# Patient Record
Sex: Female | Born: 1970 | Race: Black or African American | Hispanic: No | Marital: Single | Smoking: Never smoker
Health system: Southern US, Community
[De-identification: ages and names within clinical notes are randomized; demographics above are authoritative.]

## PROBLEM LIST (undated history)

## (undated) DIAGNOSIS — E119 Type 2 diabetes mellitus without complications: Secondary | ICD-10-CM

## (undated) DIAGNOSIS — G43909 Migraine, unspecified, not intractable, without status migrainosus: Secondary | ICD-10-CM

## (undated) DIAGNOSIS — I10 Essential (primary) hypertension: Secondary | ICD-10-CM

## (undated) DIAGNOSIS — E785 Hyperlipidemia, unspecified: Secondary | ICD-10-CM

## (undated) DIAGNOSIS — D219 Benign neoplasm of connective and other soft tissue, unspecified: Secondary | ICD-10-CM

## (undated) HISTORY — DX: Hyperlipidemia, unspecified: E78.5

## (undated) HISTORY — DX: Migraine, unspecified, not intractable, without status migrainosus: G43.909

## (undated) HISTORY — DX: Type 2 diabetes mellitus without complications: E11.9

## (undated) HISTORY — DX: Benign neoplasm of connective and other soft tissue, unspecified: D21.9

## (undated) HISTORY — DX: Essential (primary) hypertension: I10

---

## 2011-01-16 DIAGNOSIS — G43909 Migraine, unspecified, not intractable, without status migrainosus: Secondary | ICD-10-CM | POA: Insufficient documentation

## 2011-01-16 DIAGNOSIS — D649 Anemia, unspecified: Secondary | ICD-10-CM | POA: Insufficient documentation

## 2016-06-24 DIAGNOSIS — I10 Essential (primary) hypertension: Secondary | ICD-10-CM | POA: Insufficient documentation

## 2017-12-25 ENCOUNTER — Ambulatory Visit (INDEPENDENT_AMBULATORY_CARE_PROVIDER_SITE_OTHER): Payer: No Typology Code available for payment source

## 2017-12-25 ENCOUNTER — Telehealth (INDEPENDENT_AMBULATORY_CARE_PROVIDER_SITE_OTHER): Payer: Self-pay | Admitting: Orthopaedic Surgery

## 2017-12-25 ENCOUNTER — Ambulatory Visit (INDEPENDENT_AMBULATORY_CARE_PROVIDER_SITE_OTHER): Payer: No Typology Code available for payment source | Admitting: Orthopaedic Surgery

## 2017-12-25 ENCOUNTER — Encounter (INDEPENDENT_AMBULATORY_CARE_PROVIDER_SITE_OTHER): Payer: Self-pay | Admitting: Orthopaedic Surgery

## 2017-12-25 VITALS — BP 216/133 | HR 96 | Ht 66.0 in | Wt 172.0 lb

## 2017-12-25 DIAGNOSIS — M545 Low back pain, unspecified: Secondary | ICD-10-CM

## 2017-12-25 DIAGNOSIS — I10 Essential (primary) hypertension: Secondary | ICD-10-CM | POA: Diagnosis not present

## 2017-12-25 NOTE — Progress Notes (Signed)
Office Visit Note   Patient: Susan Mathis           Date of Birth: [birth year]           MRN: [id number] Visit Date: 12/25/2017              Requested by: No referring provider defined for this encounter. PCP: Patient, No Pcp Per   Assessment & Plan: Visit Diagnoses:  1. Acute midline low back pain without sciatica   2. Essential hypertension              UNCONTROLLED and discussed with pt she needs urgent treatment.   Plan: We will set patient up for course of physical therapy once is approved by [employer] patient has medication for blood pressure but has not gotten it filled.  Her blood pressure is elevated at 216/132 and she needs to seek medical treatment urgently.  Discussed with her she will not be able to have physical therapy with her blood pressure not controlled.  She likely will need a second agent to get her blood pressure down to normal.  I plan to recheck her in 5 weeks.  We discussed in detail that with uncontrolled elevated blood pressure she is at significant risk for stroke or heart attack.  Follow-Up Instructions: Return in about 5 weeks (around 01/29/2018).   Orders:  Orders Placed This Encounter  Procedures  . XR Lumbar Spine 2-3 Views  . Ambulatory referral to Physical Therapy   No orders of the defined types were placed in this encounter.     Procedures: No procedures performed   Clinical Data: No additional findings.   Subjective: Chief Complaint  Patient presents with  . Lower Back - Pain    HPI 47 year old female new patient visit for on-the-job injury that occurred on 10/13/2017.  She works at [employer] and was trying to get in order out 27 pound boxes about 12 of them and did not notice pain immediately but woke up in excruciating pain the following day mid back and mid lower back.  She was seen in the emergency department in [place] [facility] had x-rays obtained.  She is used Tylenol Goody powder.  She has had a history of some low back pain  intermittently for 4 to 5 years.  Patient states she just moved to the area will be in [place] and is not going back to [facility].  Moderate.  She denies associated bowel or bladder symptoms no fever or chills.  Review of Systems positive for hypertension on HCTZ.  Previous C-section.  History of hypertension, fibroids, and migraines.   Intermittant low back pain intermittent, off and on x4 to 5 years   Objective: Vital Signs: BP (!) 216/133   Pulse 96   Ht 5\' 6"  (1.676 m)   Wt 172 lb (78 kg)   BMI 27.76 kg/m   Physical Exam  Constitutional: She is oriented to person, place, and time. She appears well-developed.  HENT:  Head: Normocephalic.  Right Ear: External ear normal.  Left Ear: External ear normal.  Eyes: Pupils are equal, round, and reactive to light.  Neck: No tracheal deviation present. No thyromegaly present.  Cardiovascular: Normal rate.  Pulmonary/Chest: Effort normal.  Abdominal: Soft.  Neurological: She is alert and oriented to person, place, and time.  Skin: Skin is warm and dry.  Psychiatric: She has a normal mood and affect. Her behavior is normal.    Ortho Exam patient has some tenderness palpation of the  lumbar spine.  Faber test.  Knee and ankle jerk are 2+ and symmetrical.  Anterior tib gastrocsoleus is intact.  She has some discomfort with forward bending and lumbar extension as well as lateral bending.  Quads hip flexors ankle dorsiflexion plantarflexion is strong.  No venous stasis changes no rash over exposed skin.  Sensory testing the leg and foot is normal.  Specialty Comments:  No specialty comments available.  Imaging: No results found.   PMFS History: There are no active problems to display for this patient.  Past Medical History:  Diagnosis Date  . Fibroids   . Hypertension   . Migraines     No family history on file.  Past Surgical History:  Procedure Laterality Date  . CESAREAN SECTION     Social History   Occupational  History  . Not on file  Tobacco Use  . Smoking status: Never Smoker  . Smokeless tobacco: Never Used  Substance and Sexual Activity  . Alcohol use: Not Currently  . Drug use: Never  . Sexual activity: Not on file

## 2017-12-25 NOTE — Telephone Encounter (Signed)
[place] with [facility] is requesting the patients prescription for physical therapy be faxed to [facility] fax # [phone] and add the patients reference # [id number]  If there are any questions call # [phone]

## 2017-12-26 NOTE — Telephone Encounter (Signed)
Should I send this to [facility]? I see on referral where you had already faxed office note and PT referral to [facility]. Thanks.

## 2017-12-26 NOTE — Telephone Encounter (Signed)
faxed

## 2017-12-27 ENCOUNTER — Encounter (INDEPENDENT_AMBULATORY_CARE_PROVIDER_SITE_OTHER): Payer: Self-pay | Admitting: Orthopaedic Surgery

## 2018-02-10 ENCOUNTER — Telehealth (INDEPENDENT_AMBULATORY_CARE_PROVIDER_SITE_OTHER): Payer: Self-pay

## 2018-02-10 DIAGNOSIS — M545 Low back pain, unspecified: Secondary | ICD-10-CM

## 2018-02-10 NOTE — Telephone Encounter (Signed)
[facility] is requesting a rx for additional PT visits for this patient

## 2018-02-10 NOTE — Telephone Encounter (Signed)
[facility] [place] for 4 more weeks if W/C will approve it thanks

## 2018-02-10 NOTE — Telephone Encounter (Signed)
Please advise 

## 2018-02-11 NOTE — Addendum Note (Signed)
Addended by: Meyer Cory on: 02/11/2018 09:06 AM   Modules accepted: Orders

## 2018-02-11 NOTE — Telephone Encounter (Signed)
Faxed rx to [facility] @ [facility] [phone]

## 2018-02-11 NOTE — Telephone Encounter (Signed)
Entered into system.  Please let me know if you want me to fax order to them. I was not sure what you needed to do for approval.

## 2018-02-12 ENCOUNTER — Encounter (INDEPENDENT_AMBULATORY_CARE_PROVIDER_SITE_OTHER): Payer: Self-pay | Admitting: Orthopaedic Surgery

## 2018-02-12 ENCOUNTER — Ambulatory Visit (INDEPENDENT_AMBULATORY_CARE_PROVIDER_SITE_OTHER): Payer: No Typology Code available for payment source | Admitting: Orthopaedic Surgery

## 2018-02-12 VITALS — BP 181/105 | HR 93 | Ht 66.0 in | Wt 182.0 lb

## 2018-02-12 DIAGNOSIS — M545 Low back pain, unspecified: Secondary | ICD-10-CM

## 2018-02-12 MED ORDER — IBUPROFEN 800 MG PO TABS: ORAL_TABLET | ORAL | 1 refills | Status: DC

## 2018-02-12 NOTE — Progress Notes (Signed)
Office Visit Note   Patient: Susan Mathis           Date of Birth: 25-Jun-1971           MRN: [id number] Visit Date: 02/12/2018              Requested by: No referring provider defined for this encounter. PCP: Patient, No Pcp Per   Assessment & Plan: Visit Diagnoses:  1. Low back pain without sciatica, unspecified back pain laterality, unspecified chronicity   2.  Hypertension- uncontrolled and not on her previous medication  Plan: Patient requested a prescription for ibuprofen.  I supply this but stated she should not start this until she is been seen at either the teaching clinic or [facility] and gets restarted on her blood pressure medication and gets her blood pressure under control.  She is to be on HCTZ and at one point she was on HCTZ and clonidine.  Her blood pressure is elevated she needs to get this taken care of and then she can start the ibuprofen.  She will continue with therapy.  She is now living in [place] and is was terminated from her previous position.  I plan to check her in 1 month.  Follow-Up Instructions: Return in about 1 month (around 03/14/2018).   Orders:  No orders of the defined types were placed in this encounter.  Meds ordered this encounter  Medications  . ibuprofen (ADVIL,MOTRIN) 800 MG tablet    Sig: Take one tablet twice daily with meals as needed for pain.    Dispense:  60 tablet    Refill:  1      Procedures: No procedures performed   Clinical Data: No additional findings.   Subjective: Chief Complaint  Patient presents with  . Lower Back - Pain    HPI 47 year old female returns for follow-up Worker's Comp. back problem when she was working in [employer] at [place].  She is in therapy currently she states her pain is a 7 out of 10.   Date of injury when she was lifting boxes was on 10/13/2017.  Review of Systems positive for hypertension on previous medication and not on any medication currently with BP 181/105.  Past  history of back problems in the past.  Previous C-section.  Fibroids, migraines, otherwise negative as pertains HPI   Objective: Vital Signs: BP (!) 181/105   Pulse 93   Ht 5\' 6"  (1.676 m)   Wt 182 lb (82.6 kg)   BMI 29.38 kg/m   Physical Exam  Constitutional: She is oriented to person, place, and time. She appears well-developed.  HENT:  Head: Normocephalic.  Right Ear: External ear normal.  Left Ear: External ear normal.  Eyes: Pupils are equal, round, and reactive to light.  Neck: No tracheal deviation present. No thyromegaly present.  Cardiovascular: Normal rate.  Pulmonary/Chest: Effort normal.  Abdominal: Soft.  Neurological: She is alert and oriented to person, place, and time.  Skin: Skin is warm and dry.  Psychiatric: She has a normal mood and affect. Her behavior is normal.    Ortho Exam negative straight leg raising.  No sciatic notch tenderness..  She is able to heel and toe walk knee and ankle jerk are 1+ and symmetrical.  No pain with hip range of motion.  She complains of pain with palpation of the lumbosacral junction.  Specialty Comments:  No specialty comments available.  Imaging: No results found.   PMFS History: There are no active  problems to display for this patient.  Past Medical History:  Diagnosis Date  . Fibroids   . Hypertension   . Migraines     No family history on file.  Past Surgical History:  Procedure Laterality Date  . CESAREAN SECTION     Social History   Occupational History  . Not on file  Tobacco Use  . Smoking status: Never Smoker  . Smokeless tobacco: Never Used  Substance and Sexual Activity  . Alcohol use: Not Currently  . Drug use: Never  . Sexual activity: Not on file

## 2018-02-13 ENCOUNTER — Telehealth (INDEPENDENT_AMBULATORY_CARE_PROVIDER_SITE_OTHER): Payer: Self-pay

## 2018-02-13 NOTE — Telephone Encounter (Signed)
-----   Message from Marybelle Killings, MD sent at 02/12/2018 11:08 AM EDT ----- W/C

## 2018-02-13 NOTE — Telephone Encounter (Signed)
Faxed office note to [facility] Cher Nakai @ [facility] [phone]

## 2018-02-17 ENCOUNTER — Ambulatory Visit (INDEPENDENT_AMBULATORY_CARE_PROVIDER_SITE_OTHER): Payer: Self-pay | Admitting: Family Medicine

## 2018-02-17 ENCOUNTER — Other Ambulatory Visit: Payer: Self-pay

## 2018-02-17 ENCOUNTER — Encounter: Payer: Self-pay | Admitting: Family Medicine

## 2018-02-17 VITALS — BP 162/108 | HR 98 | Temp 98.0°F | Resp 16 | Ht 67.0 in | Wt 189.0 lb

## 2018-02-17 DIAGNOSIS — J301 Allergic rhinitis due to pollen: Secondary | ICD-10-CM

## 2018-02-17 DIAGNOSIS — R011 Cardiac murmur, unspecified: Secondary | ICD-10-CM

## 2018-02-17 DIAGNOSIS — E669 Obesity, unspecified: Secondary | ICD-10-CM

## 2018-02-17 DIAGNOSIS — J339 Nasal polyp, unspecified: Secondary | ICD-10-CM

## 2018-02-17 DIAGNOSIS — J329 Chronic sinusitis, unspecified: Secondary | ICD-10-CM

## 2018-02-17 DIAGNOSIS — I10 Essential (primary) hypertension: Secondary | ICD-10-CM

## 2018-02-17 LAB — POCT URINALYSIS DIPSTICK
Bilirubin, UA: NEGATIVE
Glucose, UA: NEGATIVE
Ketones, UA: NEGATIVE
Leukocytes, UA: NEGATIVE
Nitrite, UA: NEGATIVE
Protein, UA: NEGATIVE
Spec Grav, UA: 1.015 (ref 1.010–1.025)
Urobilinogen, UA: 1 E.U./dL
pH, UA: 5.5 (ref 5.0–8.0)

## 2018-02-17 LAB — POCT GLYCOSYLATED HEMOGLOBIN (HGB A1C): Hemoglobin A1C: 5.4 % (ref 4.0–5.6)

## 2018-02-17 MED ORDER — CLONIDINE HCL 0.1 MG PO TABS
0.1000 mg | ORAL_TABLET | Freq: Once | ORAL | Status: AC
  Administered 2018-02-17: 0.1 mg via ORAL

## 2018-02-17 MED ORDER — HYDROCHLOROTHIAZIDE 25 MG PO TABS: 25.0000 mg | ORAL_TABLET | Freq: Every day | ORAL | 2 refills | Status: DC

## 2018-02-17 MED ORDER — FLUTICASONE PROPIONATE 50 MCG/ACT NA SUSP: 2.0000 | Freq: Every day | NASAL | 6 refills | Status: DC

## 2018-02-17 MED ORDER — AMLODIPINE BESYLATE 5 MG PO TABS: 5.0000 mg | ORAL_TABLET | Freq: Every day | ORAL | 0 refills | Status: DC

## 2018-02-17 NOTE — Progress Notes (Signed)
Past Medical History:  Diagnosis Date  . Fibroids   . Hypertension   . Migraines    Social History   Socioeconomic History  . Marital status: Single    Spouse name: Not on file  . Number of children: Not on file  . Years of education: Not on file  . Highest education level: Not on file  Occupational History  . Not on file  Social Needs  . Financial resource strain: Not on file  . Food insecurity:    Worry: Not on file    Inability: Not on file  . Transportation needs:    Medical: Not on file    Non-medical: Not on file  Tobacco Use  . Smoking status: Never Smoker  . Smokeless tobacco: Never Used  Substance and Sexual Activity  . Alcohol use: Not Currently  . Drug use: Never  . Sexual activity: Not on file  Lifestyle  . Physical activity:    Days per week: Not on file    Minutes per session: Not on file  . Stress: Not on file  Relationships  . Social connections:    Talks on phone: Not on file    Gets together: Not on file    Attends religious service: Not on file    Active member of club or organization: Not on file    Attends meetings of clubs or organizations: Not on file    Relationship status: Not on file  . Intimate partner violence:    Fear of current or ex partner: Not on file    Emotionally abused: Not on file    Physically abused: Not on file    Forced sexual activity: Not on file  Other Topics Concern  . Not on file  Social History Narrative  . Not on file  No Known Allergies  There is no immunization history on file for this patient.   Subjective:    Patient ID: Susan Millet, female    DOB: 03-22-1971, 47 y.o.   MRN: [id number]  Patient presents to establish care. States that she has a hx of hypertension. Diagnosed at age 63. Has taken lisinopril, toprol, clondidine and HCTZ in the past without relief. Patient states that she has not had medications in the past week. Was on HCTZ 25mg . States that she is now having swelling of her legs and  ankles. Denies seeing cardiology in the past. Patient reports chest pain intermittently. Not currently experiencing. Patient reports a hx of migraines since age 19- not currently on medications. Patient with a hx of back pain- out of work due to this.   Hypertension  This is a chronic problem. The current episode started more than 1 year ago (age 62). The problem has been rapidly worsening since onset. The problem is uncontrolled. Associated symptoms include chest pain (intermittent. Not currently experiencing) and headaches. Agents associated with hypertension include NSAIDs. Past treatments include diuretics. The current treatment provides no improvement. There are no compliance problems (Patient ran out of medication x 1 week.).  There is no history of kidney disease or CAD/MI.      Review of Systems  Constitutional: Negative.   HENT: Positive for sinus pain.   Cardiovascular: Positive for chest pain (intermittent. Not currently experiencing) and leg swelling.  Gastrointestinal: Negative.   Endocrine: Negative.   Musculoskeletal: Positive for back pain (low back).  Allergic/Immunologic: Positive for environmental allergies.  Neurological: Positive for headaches. Negative for dizziness, light-headedness and numbness.  Psychiatric/Behavioral: Negative.  Objective:   Physical Exam  Constitutional: She is oriented to person, place, and time. She appears well-developed and well-nourished.  HENT:  Head: Normocephalic. Head is with raccoon's eyes.  Right Ear: Hearing, tympanic membrane, external ear and ear canal normal.  Left Ear: Hearing, tympanic membrane, external ear and ear canal normal.  Nose: Mucosal edema and sinus tenderness present. Right sinus exhibits maxillary sinus tenderness and frontal sinus tenderness. Left sinus exhibits maxillary sinus tenderness and frontal sinus tenderness.  Eyes: Pupils are equal, round, and reactive to light. Conjunctivae are normal.  Neck:  Normal range of motion. Neck supple.  Cardiovascular: Normal rate, regular rhythm and intact distal pulses. Exam reveals no gallop and no friction rub.  Murmur heard. Pulmonary/Chest: Effort normal and breath sounds normal. She has no wheezes. She has no rales.  Abdominal: Bowel sounds are normal. She exhibits no distension. There is no tenderness. There is no guarding.  Musculoskeletal: She exhibits edema (bilateral 1+ pitting bilaterally of lowe extremities).  Lymphadenopathy:    She has no cervical adenopathy.  Neurological: She is alert and oriented to person, place, and time.  Skin: Skin is warm and dry.  Psychiatric: She has a normal mood and affect. Her behavior is normal. Judgment and thought content normal.          Assessment & Plan:  1. Hypertension, unspecified type Clonidine 0.1mg  given in office. Patient tolerated medication and had a mild decrease in BP. (162/108) - cloNIDine (CATAPRES) tablet 0.1 mg - EKG 12-Lead- abnormal Referred to cardiology - CBC with Differential - Comprehensive metabolic panel - Lipid Panel - HgB A1c - Urinalysis Dipstick - Ambulatory referral to Cardiology  2. Sinusitis with nasal polyps Flonase and nasal saline   3. Allergic rhinitis due to pollen, unspecified seasonality Flonase and nasal saline  4. Obesity (BMI 30-39.9) - TSH - HgB A1c  5. Heart murmur - Ambulatory referral to Cardiology

## 2018-02-17 NOTE — Patient Instructions (Addendum)
I am starting you on a new BP medication called Norvasc. You can take this along with your HCTZ every day. I would like for you to limit your salt intake. Increase water. Increase exercise as you can tolerate it. If you have chest pain, shortness of breath, headache and dizziness, go to the nearest ED for further evaluation.  I also sent a prescription for flonase. Take 2 sprays daily in each nostril. You can also use normal saline solution to help moisturize the nasal passages.  I will see you again in 2 weeks.   Allergies An allergy is when your body reacts to a substance in a way that is not normal. An allergic reaction can happen after you:  Eat something.  Breathe in something.  Touch something.  You can be allergic to:  Things that are only around during certain seasons, like molds and pollens.  Foods.  Drugs.  Insects.  Animal dander.  What are the signs or symptoms?  Puffiness (swelling). This may happen on the lips, face, tongue, mouth, or throat.  Sneezing.  Coughing.  Breathing loudly (wheezing).  Stuffy nose.  Tingling in the mouth.  A rash.  Itching.  Itchy, red, puffy areas of skin (hives).  Watery eyes.  Throwing up (vomiting).  Watery poop (diarrhea).  Dizziness.  Feeling faint or fainting.  Trouble breathing or swallowing.  A tight feeling in the chest.  A fast heartbeat. How is this diagnosed? Allergies can be diagnosed with:  A medical and family history.  Skin tests.  Blood tests.  A food diary. A food diary is a record of all the foods, drinks, and symptoms you have each day.  The results of an elimination diet. This diet involves making sure not to eat certain foods and then seeing what happens when you start eating them again.  How is this treated? There is no cure for allergies, but allergic reactions can be treated with medicine. Severe reactions usually need to be treated at a hospital. How is this prevented? The  best way to prevent an allergic reaction is to avoid the thing you are allergic to. Allergy shots and medicines can also help prevent reactions in some cases. This information is not intended to replace advice given to you by your health care provider. Make sure you discuss any questions you have with your health care provider. Document Released: 12/01/2012 Document Revised: 04/02/2016 Document Reviewed: 05/18/2014 Elsevier Interactive Patient Education  2018 [employer]. Hypertension Hypertension is another name for high blood pressure. High blood pressure forces your heart to work harder to pump blood. This can cause problems over time. There are two numbers in a blood pressure reading. There is a top number (systolic) over a bottom number (diastolic). It is best to have a blood pressure below 120/80. Healthy choices can help lower your blood pressure. You may need medicine to help lower your blood pressure if:  Your blood pressure cannot be lowered with healthy choices.  Your blood pressure is higher than 130/80.  Follow these instructions at home: Eating and drinking  If directed, follow the DASH eating plan. This diet includes: ? Filling half of your plate at each meal with fruits and vegetables. ? Filling one quarter of your plate at each meal with whole grains. Whole grains include whole wheat pasta, brown rice, and whole grain bread. ? Eating or drinking low-fat dairy products, such as skim milk or low-fat yogurt. ? Filling one quarter of your plate at each meal  with low-fat (lean) proteins. Low-fat proteins include fish, skinless chicken, eggs, beans, and tofu. ? Avoiding fatty meat, cured and processed meat, or chicken with skin. ? Avoiding premade or processed food.  Eat less than 1,500 mg of salt (sodium) a day.  Limit alcohol use to no more than 1 drink a day for nonpregnant women and 2 drinks a day for men. One drink equals 12 oz of beer, 5 oz of wine, or 1 oz of hard  liquor. Lifestyle  Work with your doctor to stay at a healthy weight or to lose weight. Ask your doctor what the best weight is for you.  Get at least 30 minutes of exercise that causes your heart to beat faster (aerobic exercise) most days of the week. This may include walking, swimming, or biking.  Get at least 30 minutes of exercise that strengthens your muscles (resistance exercise) at least 3 days a week. This may include lifting weights or pilates.  Do not use any products that contain nicotine or tobacco. This includes cigarettes and e-cigarettes. If you need help quitting, ask your doctor.  Check your blood pressure at home as told by your doctor.  Keep all follow-up visits as told by your doctor. This is important. Medicines  Take over-the-counter and prescription medicines only as told by your doctor. Follow directions carefully.  Do not skip doses of blood pressure medicine. The medicine does not work as well if you skip doses. Skipping doses also puts you at risk for problems.  Ask your doctor about side effects or reactions to medicines that you should watch for. Contact a doctor if:  You think you are having a reaction to the medicine you are taking.  You have headaches that keep coming back (recurring).  You feel dizzy.  You have swelling in your ankles.  You have trouble with your vision. Get help right away if:  You get a very bad headache.  You start to feel confused. You feel weak or numb. Sinus Headache A sinus headache happens when your sinuses become clogged or swollen. You may feel pain or pressure in your face, forehead, ears, or upper teeth. Sinus headaches can be mild or severe. Follow these instructions at home:  Take medicines only as told by your doctor.  If you were given an antibiotic medicine, finish all of it even if you start to feel better.  Use a nose spray if you feel stuffed up (congested).  If told, apply a warm, moist washcloth  to your face to help lessen pain. Contact a doctor if:  You get headaches more than one time each week.  Light or sound bothers you.  You have a fever.  You feel sick to your stomach (nauseous) or you throw up (vomit).  Your headaches do not get better with treatment. Get help right away if:  You have trouble seeing.  You suddenly have very bad pain in your face or head.  You start to twitch or shake (seizure).  You are confused.  You have a stiff neck. This information is not intended to replace advice given to you by your health care provider. Make sure you discuss any questions you have with your health care provider. Document Released: 12/06/2010 Document Revised: 04/01/2016 Document Reviewed: 08/02/2014 Elsevier Interactive Patient Education  2018 [place] feel faint.  You get very bad pain in your: ? Chest. ? Belly (abdomen).  You throw up (vomit) more than once.  You have  trouble breathing. Summary  Hypertension is another name for high blood pressure.  Making healthy choices can help lower blood pressure. If your blood pressure cannot be controlled with healthy choices, you may need to take medicine. This information is not intended to replace advice given to you by your health care provider. Make sure you discuss any questions you have with your health care provider. Document Released: 01/23/2008 Document Revised: 07/04/2016 Document Reviewed: 07/04/2016 Elsevier Interactive Patient Education  [employer].

## 2018-02-18 LAB — CBC WITH DIFFERENTIAL/PLATELET
Basophils Absolute: 0 10*3/uL (ref 0.0–0.2)
Basos: 0 %
EOS (ABSOLUTE): 0.1 10*3/uL (ref 0.0–0.4)
Eos: 3 %
Hematocrit: 25.4 % — ABNORMAL LOW (ref 34.0–46.6)
Hemoglobin: 7.3 g/dL — ABNORMAL LOW (ref 11.1–15.9)
Immature Grans (Abs): 0 10*3/uL (ref 0.0–0.1)
Immature Granulocytes: 0 %
Lymphocytes Absolute: 1.6 10*3/uL (ref 0.7–3.1)
Lymphs: 37 %
MCH: 17.1 pg — ABNORMAL LOW (ref 26.6–33.0)
MCHC: 28.7 g/dL — ABNORMAL LOW (ref 31.5–35.7)
MCV: 60 fL — ABNORMAL LOW (ref 79–97)
Monocytes Absolute: 0.6 10*3/uL (ref 0.1–0.9)
Monocytes: 13 %
Neutrophils Absolute: 2.1 10*3/uL (ref 1.4–7.0)
Neutrophils: 47 %
Platelets: 256 10*3/uL (ref 150–450)
RBC: 4.27 x10E6/uL (ref 3.77–5.28)
RDW: 18 % — ABNORMAL HIGH (ref 12.3–15.4)
WBC: 4.4 10*3/uL (ref 3.4–10.8)

## 2018-02-18 LAB — COMPREHENSIVE METABOLIC PANEL
ALT: 7 IU/L (ref 0–32)
AST: 14 IU/L (ref 0–40)
Albumin/Globulin Ratio: 1.3 (ref 1.2–2.2)
Albumin: 4.3 g/dL (ref 3.5–5.5)
Alkaline Phosphatase: 53 IU/L (ref 39–117)
BUN/Creatinine Ratio: 27 — ABNORMAL HIGH (ref 9–23)
BUN: 13 mg/dL (ref 6–24)
Bilirubin Total: 0.3 mg/dL (ref 0.0–1.2)
CO2: 19 mmol/L — ABNORMAL LOW (ref 20–29)
Calcium: 9.3 mg/dL (ref 8.7–10.2)
Chloride: 103 mmol/L (ref 96–106)
Creatinine, Ser: 0.48 mg/dL — ABNORMAL LOW (ref 0.57–1.00)
GFR calc Af Amer: 134 mL/min/{1.73_m2} (ref >59)
GFR calc non Af Amer: 116 mL/min/{1.73_m2} (ref >59)
Globulin, Total: 3.3 g/dL (ref 1.5–4.5)
Glucose: 102 mg/dL — ABNORMAL HIGH (ref 65–99)
Potassium: 3.9 mmol/L (ref 3.5–5.2)
Sodium: 138 mmol/L (ref 134–144)
Total Protein: 7.6 g/dL (ref 6.0–8.5)

## 2018-02-18 LAB — TSH: TSH: 0.007 u[IU]/mL — ABNORMAL LOW (ref 0.450–4.500)

## 2018-02-18 LAB — LIPID PANEL
Chol/HDL Ratio: 2.5 ratio (ref 0.0–4.4)
Cholesterol, Total: 155 mg/dL (ref 100–199)
HDL: 61 mg/dL (ref >39)
LDL Calculated: 82 mg/dL (ref 0–99)
Triglycerides: 58 mg/dL (ref 0–149)
VLDL Cholesterol Cal: 12 mg/dL (ref 5–40)

## 2018-02-19 ENCOUNTER — Telehealth: Payer: Self-pay | Admitting: Family Medicine

## 2018-02-19 DIAGNOSIS — E0590 Thyrotoxicosis, unspecified without thyrotoxic crisis or storm: Secondary | ICD-10-CM

## 2018-02-19 MED ORDER — METHIMAZOLE 10 MG PO TABS: 10.0000 mg | ORAL_TABLET | Freq: Two times a day (BID) | ORAL | 0 refills | Status: DC

## 2018-02-19 NOTE — Telephone Encounter (Signed)
Called patient to inform her of abnormal TSH results. Will start on methimazole 10mg  BID. Patient states hx of anemia. Has not been taking iron. Instructed her to re-start iron supplement. Will follow up with patient in 2 weeks as previous discussed. Patient verbalized understanding and agreed with plan of care.

## 2018-02-28 ENCOUNTER — Telehealth (INDEPENDENT_AMBULATORY_CARE_PROVIDER_SITE_OTHER): Payer: Self-pay | Admitting: Orthopaedic Surgery

## 2018-02-28 NOTE — Telephone Encounter (Signed)
Returned call to [place] with [facility] left message with next appointment for the patient per his request. [phone]

## 2018-03-05 ENCOUNTER — Encounter: Payer: Self-pay | Admitting: Family Medicine

## 2018-03-05 ENCOUNTER — Ambulatory Visit (INDEPENDENT_AMBULATORY_CARE_PROVIDER_SITE_OTHER): Payer: Self-pay | Admitting: Family Medicine

## 2018-03-05 VITALS — BP 150/88 | HR 104 | Temp 98.0°F | Resp 16 | Ht 67.0 in | Wt 185.0 lb

## 2018-03-05 DIAGNOSIS — E0590 Thyrotoxicosis, unspecified without thyrotoxic crisis or storm: Secondary | ICD-10-CM

## 2018-03-05 DIAGNOSIS — I10 Essential (primary) hypertension: Secondary | ICD-10-CM

## 2018-03-05 DIAGNOSIS — D508 Other iron deficiency anemias: Secondary | ICD-10-CM

## 2018-03-05 MED ORDER — AMLODIPINE BESYLATE 10 MG PO TABS: 10.0000 mg | ORAL_TABLET | Freq: Every day | ORAL | 3 refills | Status: DC

## 2018-03-05 NOTE — Progress Notes (Signed)
    Subjective   Susan Mathis 47 y.o. female  [id number]  [id number]  09-01-1970    Chief Complaint  Patient presents with  . Hypertension    Patient presents for a follow up on HTN. Patient states that she is taking the medications as prescribed. + dizziness, + bilateral leg swelling especially in the heat. Patient with new diagnosis of hyperthyroidism. Has been taking medications daily. Patient with a referral to cardiology. Appt scheduled for August. 24 hour diet recall: watermelon, cucumber, hamburger helper.    Review of Systems  Constitutional: Negative.   HENT: Negative.   Eyes: Negative.   Respiratory: Negative.   Cardiovascular: Positive for leg swelling.  Neurological: Positive for dizziness and headaches. Negative for weakness.  Psychiatric/Behavioral: Negative.     Objective   Physical Exam  Constitutional: She is oriented to person, place, and time. She appears well-developed and well-nourished.  HENT:  Head: Normocephalic and atraumatic.  Eyes: Pupils are equal, round, and reactive to light. Conjunctivae and EOM are normal.  Neck: Normal range of motion. Neck supple. Thyromegaly (mild enlargement) present.  Cardiovascular: Normal rate, regular rhythm and intact distal pulses. Exam reveals no gallop and no friction rub.  Murmur heard. Pulmonary/Chest: Effort normal and breath sounds normal. No respiratory distress. She has no wheezes. She has no rales. She exhibits no tenderness.  Abdominal: Soft. Bowel sounds are normal. She exhibits no distension.  Musculoskeletal: She exhibits edema (bilateral non pitting LE).  Neurological: She is alert and oriented to person, place, and time.  Psychiatric: She has a normal mood and affect. Her behavior is normal. Judgment and thought content normal.  Nursing note and vitals reviewed.   BP (!) 150/88   Pulse (!) 104   Temp 98 F (36.7 C) (Oral)   Resp 16   Ht 5\' 7"  (1.702 m)   Wt 185 lb (83.9 kg)   LMP  02/14/2018   SpO2 100%   BMI 28.98 kg/m   Assessment   Encounter Diagnoses  Name Primary?  . Hypertension, unspecified type Yes  . Hyperthyroidism   . Other iron deficiency anemia   . Essential hypertension      Plan  1. Hypertension, unspecified type Increased amlodipine to 10 mg po qd. Cardiology appt pending.  - amLODipine (NORVASC) 10 MG tablet; Take 1 tablet (10 mg total) by mouth daily.  Dispense:  - Thyroid90 tablet; Refill: 3 - Comprehensive metabolic panel - CBC with Differential  2. Hyperthyroidism  Panel With TSH Will order thyroid u/s 3. Other iron deficiency anemia  - CBC with Differential - Iron, TIBC and Ferritin Panel      This note has been created with Surveyor, quantity. Any transcriptional errors are unintentional.

## 2018-03-05 NOTE — Patient Instructions (Addendum)
I am increasing the amlodipine to 10mg  daily. Continue with your other medications Hyperthyroidism Hyperthyroidism is when the thyroid is too active (overactive). Your thyroid is a large gland that is located in your neck. The thyroid helps to control how your body uses food (metabolism). When your thyroid is overactive, it produces too much of a hormone called thyroxine. What are the causes? Causes of hyperthyroidism may include:  Graves disease. This is when your immune system attacks the thyroid gland. This is the most common cause.  Inflammation of the thyroid gland.  Tumor in the thyroid gland or somewhere else.  Excessive use of thyroid medicines, including: ? Prescription thyroid supplement. ? Herbal supplements that mimic thyroid hormones.  Solid or fluid-filled lumps within your thyroid gland (thyroid nodules).  Excessive ingestion of iodine.  What increases the risk?  Being female.  Having a family history of thyroid conditions. What are the signs or symptoms? Signs and symptoms of hyperthyroidism may include:  Nervousness.  Inability to tolerate heat.  Unexplained weight loss.  Diarrhea.  Change in the texture of hair or skin.  Heart skipping beats or making extra beats.  Rapid heart rate.  Loss of menstruation.  Shaky hands.  Fatigue.  Restlessness.  Increased appetite.  Sleep problems.  Enlarged thyroid gland or nodules.  How is this diagnosed? Diagnosis of hyperthyroidism may include:  Medical history and physical exam.  Blood tests.  Ultrasound tests.  How is this treated? Treatment may include:  Medicines to control your thyroid.  Surgery to remove your thyroid.  Radiation therapy.  Follow these instructions at home:  Take medicines only as directed by your health care provider.  Do not use any tobacco products, including cigarettes, chewing tobacco, or electronic cigarettes. If you need help quitting, ask your health care  provider.  Do not exercise or do physical activity until your health care provider approves.  Keep all follow-up appointments as directed by your health care provider. This is important. Contact a health care provider if:  Your symptoms do not get better with treatment.  You have fever.  You are taking thyroid replacement medicine and you: ? Have depression. ? Feel mentally and physically slow. ? Have weight gain. Get help right away if:  You have decreased alertness or a change in your awareness.  You have abdominal pain.  You feel dizzy.  You have a rapid heartbeat.  You have an irregular heartbeat. This information is not intended to replace advice given to you by your health care provider. Make sure you discuss any questions you have with your health care provider. Document Released: 08/06/2005 Document Revised: 01/05/2016 Document Reviewed: 12/22/2013 Elsevier Interactive Patient Education  [employer]. .   Hypertension Hypertension is another name for high blood pressure. High blood pressure forces your heart to work harder to pump blood. This can cause problems over time. There are two numbers in a blood pressure reading. There is a top number (systolic) over a bottom number (diastolic). It is best to have a blood pressure below 120/80. Healthy choices can help lower your blood pressure. You may need medicine to help lower your blood pressure if:  Your blood pressure cannot be lowered with healthy choices.  Your blood pressure is higher than 130/80.  Follow these instructions at home: Eating and drinking  If directed, follow the DASH eating plan. This diet includes: ? Filling half of your plate at each meal with fruits and vegetables. ? Filling one quarter of your plate  at each meal with whole grains. Whole grains include whole wheat pasta, brown rice, and whole grain bread. ? Eating or drinking low-fat dairy products, such as skim milk or low-fat  yogurt. ? Filling one quarter of your plate at each meal with low-fat (lean) proteins. Low-fat proteins include fish, skinless chicken, eggs, beans, and tofu. ? Avoiding fatty meat, cured and processed meat, or chicken with skin. ? Avoiding premade or processed food.  Eat less than 1,500 mg of salt (sodium) a day.  Limit alcohol use to no more than 1 drink a day for nonpregnant women and 2 drinks a day for men. One drink equals 12 oz of beer, 5 oz of wine, or 1 oz of hard liquor. Lifestyle  Work with your doctor to stay at a healthy weight or to lose weight. Ask your doctor what the best weight is for you.  Get at least 30 minutes of exercise that causes your heart to beat faster (aerobic exercise) most days of the week. This may include walking, swimming, or biking.  Get at least 30 minutes of exercise that strengthens your muscles (resistance exercise) at least 3 days a week. This may include lifting weights or pilates.  Do not use any products that contain nicotine or tobacco. This includes cigarettes and e-cigarettes. If you need help quitting, ask your doctor.  Check your blood pressure at home as told by your doctor.  Keep all follow-up visits as told by your doctor. This is important. Medicines  Take over-the-counter and prescription medicines only as told by your doctor. Follow directions carefully.  Do not skip doses of blood pressure medicine. The medicine does not work as well if you skip doses. Skipping doses also puts you at risk for problems.  Ask your doctor about side effects or reactions to medicines that you should watch for. Contact a doctor if:  You think you are having a reaction to the medicine you are taking.  You have headaches that keep coming back (recurring).  You feel dizzy.  You have swelling in your ankles.  You have trouble with your vision. Get help right away if:  You get a very bad headache.  You start to feel confused.  You feel weak or  numb.  You feel faint.  You get very bad pain in your: ? Chest. ? Belly (abdomen).  You throw up (vomit) more than once.  You have trouble breathing. Summary  Hypertension is another name for high blood pressure.  Making healthy choices can help lower blood pressure. If your blood pressure cannot be controlled with healthy choices, you may need to take medicine. This information is not intended to replace advice given to you by your health care provider. Make sure you discuss any questions you have with your health care provider. Document Released: 01/23/2008 Document Revised: 07/04/2016 Document Reviewed: 07/04/2016 Elsevier Interactive Patient Education  2018 [employer].   Low-Sodium Eating Plan Sodium, which is an element that makes up salt, helps you maintain a healthy balance of fluids in your body. Too much sodium can increase your blood pressure and cause fluid and waste to be held in your body. Your health care provider or dietitian may recommend following this plan if you have high blood pressure (hypertension), kidney disease, liver disease, or heart failure. Eating less sodium can help lower your blood pressure, reduce swelling, and protect your heart, liver, and kidneys. What are tips for following this plan? General guidelines  Most people on this plan  should limit their sodium intake to 1,500-2,000 mg (milligrams) of sodium each day. Reading food labels  The Nutrition Facts label lists the amount of sodium in one serving of the food. If you eat more than one serving, you must multiply the listed amount of sodium by the number of servings.  Choose foods with less than 140 mg of sodium per serving.  Avoid foods with 300 mg of sodium or more per serving. Shopping  Look for lower-sodium products, often labeled as "low-sodium" or "no salt added."  Always check the sodium content even if foods are labeled as "unsalted" or "no salt added".  Buy fresh foods. ? Avoid  canned foods and premade or frozen meals. ? Avoid canned, cured, or processed meats  Buy breads that have less than 80 mg of sodium per slice. Cooking  Eat more home-cooked food and less restaurant, buffet, and fast food.  Avoid adding salt when cooking. Use salt-free seasonings or herbs instead of table salt or sea salt. Check with your health care provider or pharmacist before using salt substitutes.  Cook with plant-based oils, such as canola, sunflower, or olive oil. Meal planning  When eating at a restaurant, ask that your food be prepared with less salt or no salt, if possible.  Avoid foods that contain MSG (monosodium glutamate). MSG is sometimes added to [place] food, bouillon, and some canned foods. What foods are recommended? The items listed may not be a complete list. Talk with your dietitian about what dietary choices are best for you. Grains Low-sodium cereals, including oats, puffed wheat and rice, and shredded wheat. Low-sodium crackers. Unsalted rice. Unsalted pasta. Low-sodium bread. Whole-grain breads and whole-grain pasta. Vegetables Fresh or frozen vegetables. "No salt added" canned vegetables. "No salt added" tomato sauce and paste. Low-sodium or reduced-sodium tomato and vegetable juice. Fruits Fresh, frozen, or canned fruit. Fruit juice. Meats and other protein foods Fresh or frozen (no salt added) meat, poultry, seafood, and fish. Low-sodium canned tuna and salmon. Unsalted nuts. Dried peas, beans, and lentils without added salt. Unsalted canned beans. Eggs. Unsalted nut butters. Dairy Milk. Soy milk. Cheese that is naturally low in sodium, such as ricotta cheese, fresh mozzarella, or Swiss cheese Low-sodium or reduced-sodium cheese. Cream cheese. Yogurt. Fats and oils Unsalted butter. Unsalted margarine with no trans fat. Vegetable oils such as canola or olive oils. Seasonings and other foods Fresh and dried herbs and spices. Salt-free seasonings. Low-sodium  mustard and ketchup. Sodium-free salad dressing. Sodium-free light mayonnaise. Fresh or refrigerated horseradish. Lemon juice. Vinegar. Homemade, reduced-sodium, or low-sodium soups. Unsalted popcorn and pretzels. Low-salt or salt-free chips. What foods are not recommended? The items listed may not be a complete list. Talk with your dietitian about what dietary choices are best for you. Grains Instant hot cereals. Bread stuffing, pancake, and biscuit mixes. Croutons. Seasoned rice or pasta mixes. Noodle soup cups. Boxed or frozen macaroni and cheese. Regular salted crackers. Self-rising flour. Vegetables Sauerkraut, pickled vegetables, and relishes. Olives. [place] fries. Onion rings. Regular canned vegetables (not low-sodium or reduced-sodium). Regular canned tomato sauce and paste (not low-sodium or reduced-sodium). Regular tomato and vegetable juice (not low-sodium or reduced-sodium). Frozen vegetables in sauces. Meats and other protein foods Meat or fish that is salted, canned, smoked, spiced, or pickled. Bacon, ham, sausage, hotdogs, corned beef, chipped beef, packaged lunch meats, salt pork, jerky, pickled herring, anchovies, regular canned tuna, sardines, salted nuts. Dairy Processed cheese and cheese spreads. Cheese curds. Blue cheese. Feta cheese. String cheese. Regular cottage cheese.  Buttermilk. Canned milk. Fats and oils Salted butter. Regular margarine. Ghee. Bacon fat. Seasonings and other foods Onion salt, garlic salt, seasoned salt, table salt, and sea salt. Canned and packaged gravies. Worcestershire sauce. Tartar sauce. Barbecue sauce. Teriyaki sauce. Soy sauce, including reduced-sodium. Steak sauce. Fish sauce. Oyster sauce. Cocktail sauce. Horseradish that you find on the shelf. Regular ketchup and mustard. Meat flavorings and tenderizers. Bouillon cubes. Hot sauce and Tabasco sauce. Premade or packaged marinades. Premade or packaged taco seasonings. Relishes. Regular salad  dressings. Salsa. Potato and tortilla chips. Corn chips and puffs. Salted popcorn and pretzels. Canned or dried soups. Pizza. Frozen entrees and pot pies. Summary  Eating less sodium can help lower your blood pressure, reduce swelling, and protect your heart, liver, and kidneys.  Most people on this plan should limit their sodium intake to 1,500-2,000 mg (milligrams) of sodium each day.  Canned, boxed, and frozen foods are high in sodium. Restaurant foods, fast foods, and pizza are also very high in sodium. You also get sodium by adding salt to food.  Try to cook at home, eat more fresh fruits and vegetables, and eat less fast food, canned, processed, or prepared foods. This information is not intended to replace advice given to you by your health care provider. Make sure you discuss any questions you have with your health care provider. Document Released: 01/26/2002 Document Revised: 07/30/2016 Document Reviewed: 07/30/2016 Elsevier Interactive Patient Education  [employer].

## 2018-03-06 LAB — CBC WITH DIFFERENTIAL/PLATELET
Basophils Absolute: 0 10*3/uL (ref 0.0–0.2)
Basos: 1 %
EOS (ABSOLUTE): 0.2 10*3/uL (ref 0.0–0.4)
Eos: 4 %
Hematocrit: 29.9 % — ABNORMAL LOW (ref 34.0–46.6)
Hemoglobin: 9 g/dL — ABNORMAL LOW (ref 11.1–15.9)
Immature Grans (Abs): 0 10*3/uL (ref 0.0–0.1)
Immature Granulocytes: 0 %
Lymphocytes Absolute: 1.4 10*3/uL (ref 0.7–3.1)
Lymphs: 31 %
MCH: 18.8 pg — ABNORMAL LOW (ref 26.6–33.0)
MCHC: 30.1 g/dL — ABNORMAL LOW (ref 31.5–35.7)
MCV: 62 fL — ABNORMAL LOW (ref 79–97)
Monocytes Absolute: 0.9 10*3/uL (ref 0.1–0.9)
Monocytes: 20 %
Neutrophils Absolute: 2 10*3/uL (ref 1.4–7.0)
Neutrophils: 44 %
Platelets: 251 10*3/uL (ref 150–450)
RBC: 4.79 x10E6/uL (ref 3.77–5.28)
RDW: 23.3 % — ABNORMAL HIGH (ref 12.3–15.4)
WBC: 4.5 10*3/uL (ref 3.4–10.8)

## 2018-03-06 LAB — COMPREHENSIVE METABOLIC PANEL
ALT: 13 IU/L (ref 0–32)
AST: 14 IU/L (ref 0–40)
Albumin/Globulin Ratio: 1.3 (ref 1.2–2.2)
Albumin: 4.5 g/dL (ref 3.5–5.5)
Alkaline Phosphatase: 71 IU/L (ref 39–117)
BUN/Creatinine Ratio: 24 — ABNORMAL HIGH (ref 9–23)
BUN: 12 mg/dL (ref 6–24)
Bilirubin Total: 0.3 mg/dL (ref 0.0–1.2)
CO2: 23 mmol/L (ref 20–29)
Calcium: 10 mg/dL (ref 8.7–10.2)
Chloride: 96 mmol/L (ref 96–106)
Creatinine, Ser: 0.49 mg/dL — ABNORMAL LOW (ref 0.57–1.00)
GFR calc Af Amer: 133 mL/min/{1.73_m2} (ref >59)
GFR calc non Af Amer: 116 mL/min/{1.73_m2} (ref >59)
Globulin, Total: 3.5 g/dL (ref 1.5–4.5)
Glucose: 123 mg/dL — ABNORMAL HIGH (ref 65–99)
Potassium: 4 mmol/L (ref 3.5–5.2)
Sodium: 135 mmol/L (ref 134–144)
Total Protein: 8 g/dL (ref 6.0–8.5)

## 2018-03-06 LAB — IRON,TIBC AND FERRITIN PANEL
Ferritin: 22 ng/mL (ref 15–150)
Iron Saturation: >96 % (ref 15–55)
Iron: 459 ug/dL (ref 27–159)
Total Iron Binding Capacity: <476 ug/dL — ABNORMAL HIGH (ref 250–450)
UIBC: <17 ug/dL — ABNORMAL LOW (ref 131–425)

## 2018-03-06 LAB — THYROID PANEL WITH TSH
Free Thyroxine Index: 7.9 — ABNORMAL HIGH (ref 1.2–4.9)
T3 Uptake Ratio: 49 % — ABNORMAL HIGH (ref 24–39)
T4, Total: 16.2 ug/dL — ABNORMAL HIGH (ref 4.5–12.0)
TSH: <0.006 u[IU]/mL — ABNORMAL LOW (ref 0.450–4.500)

## 2018-03-12 ENCOUNTER — Encounter (INDEPENDENT_AMBULATORY_CARE_PROVIDER_SITE_OTHER): Payer: Self-pay | Admitting: Orthopaedic Surgery

## 2018-03-12 ENCOUNTER — Telehealth: Payer: Self-pay

## 2018-03-12 ENCOUNTER — Other Ambulatory Visit: Payer: Self-pay | Admitting: Family Medicine

## 2018-03-12 ENCOUNTER — Ambulatory Visit (INDEPENDENT_AMBULATORY_CARE_PROVIDER_SITE_OTHER): Payer: No Typology Code available for payment source | Admitting: Orthopaedic Surgery

## 2018-03-12 VITALS — BP 151/98 | HR 91 | Ht 67.0 in | Wt 185.0 lb

## 2018-03-12 DIAGNOSIS — M545 Low back pain, unspecified: Secondary | ICD-10-CM

## 2018-03-12 DIAGNOSIS — E0590 Thyrotoxicosis, unspecified without thyrotoxic crisis or storm: Secondary | ICD-10-CM

## 2018-03-12 NOTE — Telephone Encounter (Signed)
Called and spoke with patient, advised that we are referring her to endocrinology for further evaluation of hyperthyroidism. Advised that we have scheduled an ultrasound of thyroid for 03/18/2018 @12 :15pm. Advised that anemia has improved and she can stop iron supplement for now. Asked to increase water intake and that we will repeat her labs at next visit. Thanks!

## 2018-03-12 NOTE — Progress Notes (Signed)
Please let patient know that I am referring her to the endo.

## 2018-03-12 NOTE — Telephone Encounter (Signed)
-----   Message from Lanae Boast, [place] sent at 03/12/2018  8:11 AM EDT ----- I would like to refer this patient to endocrinology for further evaluation of hyperthyroidism. Please inform her that her anemia has improved since the last visit. If she is taking an iron supplement, please stop it for now. Increase her fluids (water). I will repeat her labs at her next visit.

## 2018-03-12 NOTE — Addendum Note (Signed)
Addended by: Meyer Cory on: 03/12/2018 11:39 AM   Modules accepted: Orders

## 2018-03-12 NOTE — Progress Notes (Addendum)
Office Visit Note   Patient: Susan Mathis           Date of Birth: [birth year]           MRN: [id number] Visit Date: 03/12/2018              Requested by: No referring provider defined for this encounter. PCP: Lanae Boast, FNP   Assessment & Plan: Visit Diagnoses:  1. Low back pain without sciatica, unspecified back pain laterality, unspecified chronicity     Plan: Patient's had ongoing pain since her on-the-job injury 10/13/2017.  She has been on anti-inflammatories she is on her second set of physical therapy.  I recommend proceeding with an MRI scan.  She is looking for other job opportunities.  I plan to see her back after the MRI.  Follow-Up Instructions: No follow-ups on file.   Orders:  No orders of the defined types were placed in this encounter.  No orders of the defined types were placed in this encounter.     Procedures: No procedures performed   Clinical Data: No additional findings.   Subjective: Chief Complaint  Patient presents with  . Lower Back - Pain, Follow-up    HPI for 47 year old female returns for follow-up of Worker's Comp. injury she is here with her boyfriend.  Date of injury was 10/13/2017.  She is on her second round of physical therapy she has had a total of 9 or 10 visits so far.  She is had persistent pain it seems to do a little bit better with walking.  She has some pain when she is in supine position she denies associated bowel bladder symptoms no fever or chills.  She is taken anti-inflammatories without relief.  She is had some problems with hypertension and has had her medications adjusted and blood pressure is better today but is still elevated.  Review of Systems 14 point review of systems updated unchanged from 12/25/2017 office visit.  Of note is hypertension which she is working on trying to get controlled.   Objective: Vital Signs: BP (!) 151/98   Pulse 91   Ht 5\' 7"  (1.702 m)   Wt 185 lb (83.9 kg)   LMP 02/14/2018   BMI  28.98 kg/m   Physical Exam  Constitutional: She is oriented to person, place, and time. She appears well-developed.  HENT:  Head: Normocephalic.  Right Ear: External ear normal.  Left Ear: External ear normal.  Eyes: Pupils are equal, round, and reactive to light.  Neck: No tracheal deviation present. No thyromegaly present.  Cardiovascular: Normal rate.  Pulmonary/Chest: Effort normal.  Abdominal: Soft.  Neurological: She is alert and oriented to person, place, and time.  Skin: Skin is warm and dry.  Psychiatric: She has a normal mood and affect. Her behavior is normal.    Ortho Exam patient has negative logroll of the hip she is able get from sitting sitting to standing.  Anterior tib EHL is intact.  Knees reach full extension.  She has some mild trace edema of her feet and ankles.  Anterior tib gastrocsoleus is intact.  No atrophy of the quads.  He has tenderness at the lumbosacral junction with palpation that extends up to the posterior superior iliac spine.  Negative Faber test.  She continues to have discomfort with forward bending and extension.  Specialty Comments:  No specialty comments available.  Imaging: No results found.   PMFS History: Patient Active Problem List   Diagnosis Date Noted  . Essential  hypertension 06/24/2016  . Migraine headache 01/16/2011   Past Medical History:  Diagnosis Date  . Fibroids   . Hypertension   . Migraines     No family history on file.  Past Surgical History:  Procedure Laterality Date  . CESAREAN SECTION     Social History   Occupational History  . Not on file  Tobacco Use  . Smoking status: Never Smoker  . Smokeless tobacco: Never Used  Substance and Sexual Activity  . Alcohol use: Not Currently  . Drug use: Never  . Sexual activity: Not on file

## 2018-03-13 ENCOUNTER — Telehealth (INDEPENDENT_AMBULATORY_CARE_PROVIDER_SITE_OTHER): Payer: Self-pay

## 2018-03-13 NOTE — Telephone Encounter (Signed)
-----   Message from Marybelle Killings, MD sent at 03/12/2018 11:15 AM EDT ----- Copy to Worker's Comp., thank you

## 2018-03-13 NOTE — Telephone Encounter (Signed)
Faxed note to [facility] adj Apolin[facility] Junes  [phone]

## 2018-03-18 ENCOUNTER — Ambulatory Visit (HOSPITAL_COMMUNITY): Payer: Self-pay

## 2018-03-24 ENCOUNTER — Ambulatory Visit (HOSPITAL_COMMUNITY): Payer: Self-pay | Attending: Family Medicine

## 2018-03-26 ENCOUNTER — Encounter: Payer: Self-pay | Admitting: Internal Medicine

## 2018-03-26 ENCOUNTER — Ambulatory Visit (INDEPENDENT_AMBULATORY_CARE_PROVIDER_SITE_OTHER): Payer: Self-pay | Admitting: Internal Medicine

## 2018-03-26 VITALS — BP 156/94 | HR 80 | Ht 66.0 in | Wt 191.2 lb

## 2018-03-26 DIAGNOSIS — I10 Essential (primary) hypertension: Secondary | ICD-10-CM

## 2018-03-26 NOTE — Patient Instructions (Addendum)
Dr. Debara Pickett has requested that you obtain a blood pressure cuff.  An arm cuff is more reliable than a wrist cuff.  Omron is a brand of BP cuff.  Please bring your BP readings with you to your next appointment.  HOW TO TAKE YOUR BLOOD PRESSURE:  Rest 5 minutes before taking your blood pressure.  Don't smoke or drink caffeinated beverages for at least 30 minutes before.  Take your blood pressure before (not after) you eat.  Sit comfortably with your back supported and both feet on the floor (don't cross your legs).  Elevate your arm to heart level on a table or a desk.  Use the proper sized cuff. It should fit smoothly and snugly around your bare upper arm. There should be enough room to slip a fingertip under the cuff. The bottom edge of the cuff should be 1 inch above the crease of the elbow.  Ideally, take 3 measurements at one sitting and record the average.  Your physician recommends that you schedule a follow-up appointment in: [place] with Dr. Debara Pickett (for BP management)

## 2018-03-26 NOTE — Progress Notes (Signed)
OFFICE CONSULT NOTE  Chief Complaint:  Evaluate for CHF  Primary Care Physician: Susan Boast, FNP  HPI:  Susan Mathis is a 47 y.o. female who is being seen today for the evaluation of CHF at the request of Susan Mathis, [place].  This is a pleasant 47 year old female who was previously residing in [place], with history of hypertension, fibroids and migraine headaches.  She has been noting worsening lower extremity edema, dizziness and recently was found to have hyperthyroidism in the setting of hair loss and hyperpyrexia.  She is now on methimazole.  She has a history of echocardiogram which was performed in 2017 at [facility] -this demonstrated normal LVEF of 69%, mild right atrial enlargement, mild aortic root dilatation at 3.9 cm and "probably mildly elevated pulmonary artery pressure".  She denies chest pain or worsening shortness of breath.  EKG today shows sinus rhythm at 80, possible left atrial enlargement and nonspecific T wave changes.  Blood pressure was elevated 156/94.  She does not follow her blood pressure at home as she was not able to get a blood pressure cuff.  She is currently self-pay and is without any health insurance.  PMHx:  Past Medical History:  Diagnosis Date  . Fibroids   . Hypertension   . Migraines     Past Surgical History:  Procedure Laterality Date  . CESAREAN SECTION      FAMHx:  Family History  Problem Relation Age of Onset  . Hypertension Mother   . Hypertension Father     SOCHx:   reports that she has never smoked. She has never used smokeless tobacco. She reports that she drank alcohol. She reports that she does not use drugs.  ALLERGIES:  No Known Allergies  ROS: Pertinent items noted in HPI and remainder of comprehensive ROS otherwise negative.  HOME MEDS: Current Outpatient Medications on File Prior to Visit  Medication Sig Dispense Refill  . amLODipine (NORVASC) 10 MG tablet Take 1 tablet (10 mg total) by mouth daily. 90  tablet 3  . fluticasone (FLONASE) 50 MCG/ACT nasal spray Place 2 sprays into both nostrils daily. 16 g 6  . hydrochlorothiazide (HYDRODIURIL) 25 MG tablet Take 1 tablet (25 mg total) by mouth daily. 30 tablet 2  . ibuprofen (ADVIL,MOTRIN) 800 MG tablet TAKE 1 TABLET BY MOUTH TWICE DAILY WITH MEALS AS NEEDED FOR PAIN  1  . methimazole (TAPAZOLE) 10 MG tablet Take 10 mg by mouth 2 (two) times daily.     No current facility-administered medications on file prior to visit.     LABS/IMAGING: No results found for this or any previous visit (from the past 48 hour(s)). No results found.  LIPID PANEL:    Component Value Date/Time   CHOL 155 02/17/2018 0953   TRIG 58 02/17/2018 0953   HDL 61 02/17/2018 0953   CHOLHDL 2.5 02/17/2018 0953   LDLCALC 82 02/17/2018 0953    WEIGHTS: Wt Readings from Last 3 Encounters:  03/26/18 191 lb 3.2 oz (86.7 kg)  03/12/18 185 lb (83.9 kg)  03/05/18 185 lb (83.9 kg)    VITALS: BP (!) 156/94   Pulse 80   Ht 5\' 6"  (1.676 m)   Wt 191 lb 3.2 oz (86.7 kg)   BMI 30.86 kg/m   EXAM: General appearance: alert and no distress Neck: no carotid bruit, no JVD and thyroid not enlarged, symmetric, no tenderness/mass/nodules Lungs: clear to auscultation bilaterally Heart: regular rate and rhythm and systolic murmur: early systolic 2/6, blowing at 2nd  right intercostal space Abdomen: soft, non-tender; bowel sounds normal; no masses,  no organomegaly Extremities: extremities normal, atraumatic, no cyanosis or edema Pulses: 2+ and symmetric Skin: Skin color, texture, turgor normal. No rashes or lesions Neurologic: Grossly normal Psych: Pleasant  EKG: Normal sinus rhythm at 80, possible left atrial enlargement, nonspecific T wave changes- personally reviewed  ASSESSMENT: 1. Uncontrolled hypertension 2. Leg edema  PLAN: 1. 1.   [name] reports some lower extremity edema and may have poorly controlled hypertension.  Blood pressure is elevated today.  Is  unclear what her home readings are.  She did have an echocardiogram in 2017 which showed normal systolic function.  Pulmonary artery pressure was elevated.  Her edema may be related to her amlodipine as well.  Before further adjusting her blood pressure medications, I would recommend her to purchase a blood pressure cuff and obtain blood pressure readings for me to review when she comes back for follow-up.  Based on this I can further titrate her medications.  She has no signs or symptoms of heart failure on exam today and given her fairly recent echo and lack of health insurance, I will not recommend repeating that.  Thanks for the referral.  Follow-up with me for blood pressure management in 1 to 2 months.  Susan Casino, MD, [facility], [place] Director of the [facility]  [facility] Diplomate of the American Board of Clinical Lipidology Attending Cardiologist  Direct Dial: [phone]  Fax: [phone]  Website:  [email or web address]  Susan Mathis 03/26/2018, 6:11 PM

## 2018-03-28 ENCOUNTER — Encounter: Payer: Self-pay | Admitting: Endocrinology

## 2018-03-31 ENCOUNTER — Telehealth (INDEPENDENT_AMBULATORY_CARE_PROVIDER_SITE_OTHER): Payer: Self-pay | Admitting: Orthopaedic Surgery

## 2018-03-31 NOTE — Telephone Encounter (Signed)
Susan Mathis from [facility] requested the MRI order be faced to the following two numbers [facility] Fax: [phone] and [facility] at [phone].  Susan Mathis's CB#[phone].  Thank you.

## 2018-03-31 NOTE — Telephone Encounter (Signed)
Faxed to both

## 2018-04-07 ENCOUNTER — Ambulatory Visit (INDEPENDENT_AMBULATORY_CARE_PROVIDER_SITE_OTHER): Payer: Self-pay | Admitting: Family Medicine

## 2018-04-07 VITALS — BP 145/88 | HR 84 | Temp 97.9°F | Resp 16 | Ht 66.0 in | Wt 192.0 lb

## 2018-04-07 DIAGNOSIS — I10 Essential (primary) hypertension: Secondary | ICD-10-CM

## 2018-04-07 DIAGNOSIS — E0590 Thyrotoxicosis, unspecified without thyrotoxic crisis or storm: Secondary | ICD-10-CM

## 2018-04-07 NOTE — Patient Instructions (Signed)
Hyperthyroidism Hyperthyroidism is when the thyroid is too active (overactive). Your thyroid is a large gland that is located in your neck. The thyroid helps to control how your body uses food (metabolism). When your thyroid is overactive, it produces too much of a hormone called thyroxine. What are the causes? Causes of hyperthyroidism may include:  Graves disease. This is when your immune system attacks the thyroid gland. This is the most common cause.  Inflammation of the thyroid gland.  Tumor in the thyroid gland or somewhere else.  Excessive use of thyroid medicines, including: ? Prescription thyroid supplement. ? Herbal supplements that mimic thyroid hormones.  Solid or fluid-filled lumps within your thyroid gland (thyroid nodules).  Excessive ingestion of iodine.  What increases the risk?  Being female.  Having a family history of thyroid conditions. What are the signs or symptoms? Signs and symptoms of hyperthyroidism may include:  Nervousness.  Inability to tolerate heat.  Unexplained weight loss.  Diarrhea.  Change in the texture of hair or skin.  Heart skipping beats or making extra beats.  Rapid heart rate.  Loss of menstruation.  Shaky hands.  Fatigue.  Restlessness.  Increased appetite.  Sleep problems.  Enlarged thyroid gland or nodules.  How is this diagnosed? Diagnosis of hyperthyroidism may include:  Medical history and physical exam.  Blood tests.  Ultrasound tests.  How is this treated? Treatment may include:  Medicines to control your thyroid.  Surgery to remove your thyroid.  Radiation therapy.  Follow these instructions at home:  Take medicines only as directed by your health care provider.  Do not use any tobacco products, including cigarettes, chewing tobacco, or electronic cigarettes. If you need help quitting, ask your health care provider.  Do not exercise or do physical activity until your health care provider  approves.  Keep all follow-up appointments as directed by your health care provider. This is important. Contact a health care provider if:  Your symptoms do not get better with treatment.  You have fever.  You are taking thyroid replacement medicine and you: ? Have depression. ? Feel mentally and physically slow. ? Have weight gain. Get help right away if:  You have decreased alertness or a change in your awareness.  You have abdominal pain.  You feel dizzy.  You have a rapid heartbeat.  You have an irregular heartbeat. This information is not intended to replace advice given to you by your health care provider. Make sure you discuss any questions you have with your health care provider. Document Released: 08/06/2005 Document Revised: 01/05/2016 Document Reviewed: 12/22/2013 Elsevier Interactive Patient Education  2018 Elsevier Inc.  

## 2018-04-07 NOTE — Progress Notes (Signed)
Patient [facility]   Progress Note: General Provider: Lanae Boast, FNP  SUBJECTIVE:   Chief Complaint  Patient presents with  . Hypertension  . Headache  . Eye Pain    right eye     Patient presents for follow up on HTN and Hyperthyroid. She was seen by cardiology 03/26/2018 by Dr. Debara Pickett. Patient was instructed to keep record of her BP readings at home. No medication changes noted at this time.  Patient states that she will need to have [facility] discount prior to being seen with endocrinology for hyperthyroidism. She is currently uninsured. Patient continues to have lower extremity edema.  Denies chest pain   Past Medical History:  Diagnosis Date  . Fibroids   . Hypertension   . Migraines     Social History   Socioeconomic History  . Marital status: Single    Spouse name: Not on file  . Number of children: Not on file  . Years of education: Not on file  . Highest education level: Not on file  Occupational History  . Not on file  Social Needs  . Financial resource strain: Not on file  . Food insecurity:    Worry: Not on file    Inability: Not on file  . Transportation needs:    Medical: Not on file    Non-medical: Not on file  Tobacco Use  . Smoking status: Never Smoker  . Smokeless tobacco: Never Used  Substance and Sexual Activity  . Alcohol use: Not Currently  . Drug use: Never  . Sexual activity: Not on file  Lifestyle  . Physical activity:    Days per week: Not on file    Minutes per session: Not on file  . Stress: Not on file  Relationships  . Social connections:    Talks on phone: Not on file    Gets together: Not on file    Attends religious service: Not on file    Active member of club or organization: Not on file    Attends meetings of clubs or organizations: Not on file    Relationship status: Not on file  . Intimate partner violence:    Fear of current or ex partner: Not on file    Emotionally  abused: Not on file    Physically abused: Not on file    Forced sexual activity: Not on file  Other Topics Concern  . Not on file  Social History Narrative  . Not on file      Review of Systems  Constitutional: Negative.   HENT: Negative.   Eyes: Positive for pain. Negative for discharge and redness.  Respiratory: Negative.   Cardiovascular: Positive for leg swelling.  Gastrointestinal: Negative.   Genitourinary: Negative.   Musculoskeletal: Negative.   Skin: Negative.   Neurological: Positive for headaches.  Psychiatric/Behavioral: Negative.     OBJECTIVE: BP (!) 145/88 (BP Location: Left Arm, Patient Position: Sitting, Cuff Size: Normal)   Pulse 84   Temp 97.9 F (36.6 C) (Oral)   Resp 16   Ht 5\' 6"  (1.676 m)   Wt 192 lb (87.1 kg)   SpO2 100%   BMI 30.99 kg/m   Physical Exam  Constitutional: She is oriented to person, place, and time. She appears well-developed and well-nourished. No distress.  HENT:  Head: Normocephalic and atraumatic.  Eyes: Pupils are equal, round, and reactive to light. Conjunctivae and EOM are normal.  Neck: Normal range of motion. Neck supple.  No JVD present. No thyromegaly (mild enlargement) present.  Cardiovascular: Normal rate, regular rhythm, normal heart sounds and intact distal pulses. Exam reveals no gallop and no friction rub.  No murmur heard. Pulmonary/Chest: Effort normal and breath sounds normal. No respiratory distress. She has no wheezes. She has no rales. She exhibits no tenderness.  Abdominal: Soft. Bowel sounds are normal. She exhibits no distension and no mass. There is no tenderness.  Musculoskeletal: Normal range of motion. Edema: bilateral LE non-pitting.  Lymphadenopathy:    She has no cervical adenopathy.  Neurological: She is alert and oriented to person, place, and time. She has normal strength. She displays a negative Romberg sign.  Skin: Skin is warm and dry.  Psychiatric: She has a normal mood and affect. Her  behavior is normal. Judgment and thought content normal.  Nursing note and vitals reviewed.   ASSESSMENT/PLAN:  1. Hyperthyroidism The current medical regimen is effective;  continue present plan and medications.  - Thyroid Panel With TSH  2. Essential hypertension Patient to continue to log BP  continue present plan and medications.           The patient was given clear instructions to go to ER or return to medical center if symptoms do not improve, worsen or new problems develop. The patient verbalized understanding and agreed with plan of care.   [name], FNP-BC [facility] [address] [place], [place] [address] [phone]     This note has been created with Dragon speech recognition software and smart phrase technology. Any transcriptional errors are unintentional.

## 2018-04-08 LAB — THYROID PANEL WITH TSH
Free Thyroxine Index: 4.6 (ref 1.2–4.9)
T3 Uptake Ratio: 38 % (ref 24–39)
T4, Total: 12 ug/dL (ref 4.5–12.0)
TSH: 0.01 u[IU]/mL — ABNORMAL LOW (ref 0.450–4.500)

## 2018-04-09 ENCOUNTER — Telehealth: Payer: Self-pay | Admitting: Family Medicine

## 2018-04-09 NOTE — Telephone Encounter (Signed)
Called patient to follow up with headache, right eye pain and BP readings. Patient states that her headache and eye pain have improved. She states that she has not been able to get a BP cuff but has been going to the local pharmacy for BP checks and states that they are "good". Patient voiced appreciation for the call. No further concerns.

## 2018-04-11 ENCOUNTER — Encounter: Payer: Self-pay | Admitting: Family Medicine

## 2018-04-14 ENCOUNTER — Ambulatory Visit (INDEPENDENT_AMBULATORY_CARE_PROVIDER_SITE_OTHER): Payer: Self-pay | Admitting: Orthopaedic Surgery

## 2018-04-14 ENCOUNTER — Ambulatory Visit (INDEPENDENT_AMBULATORY_CARE_PROVIDER_SITE_OTHER): Payer: No Typology Code available for payment source | Admitting: Orthopaedic Surgery

## 2018-04-14 ENCOUNTER — Encounter (INDEPENDENT_AMBULATORY_CARE_PROVIDER_SITE_OTHER): Payer: Self-pay | Admitting: Orthopaedic Surgery

## 2018-04-14 VITALS — BP 130/79 | HR 90 | Ht 66.0 in | Wt 192.0 lb

## 2018-04-14 DIAGNOSIS — S39012D Strain of muscle, fascia and tendon of lower back, subsequent encounter: Secondary | ICD-10-CM | POA: Diagnosis not present

## 2018-04-14 NOTE — Progress Notes (Signed)
Office Visit Note   Patient: Susan Mathis           Date of Birth: 1970/11/10           MRN: [id number] Visit Date: 04/14/2018              Requested by: Susan Mathis, Susan Mathis, [place] [address] PCP: Susan Boast, FNP   Assessment & Plan: Visit Diagnoses:  1. Strain of lumbar region, subsequent encounter     Plan: Patient had on-the-job injury in February.  MRI scan is reviewed with patient and her boyfriend today.  I gave her a copy of the report.  I discussed with her that no additional imaging is required and no additional treatment such as surgery epidurals as indicated.  She can look for a job where she is doing some sitting standing frequently moving around.  No impairment is assigned an office follow-up is as needed.  Based on the MRI and exam I did not put any future specific work restrictions on her.  We discussed using commonsense when she looks for a job in its physical requirements as to whether she felt should be able to do that type of activity.  Follow-Up Instructions: No follow-ups on file.   Orders:  No orders of the defined types were placed in this encounter.  No orders of the defined types were placed in this encounter.     Procedures: No procedures performed   Clinical Data: No additional findings.   Subjective: Chief Complaint  Patient presents with  . Lower Back - Pain, Follow-up    MRI Lumbar Review    HPI 47 year old female returns for follow-up after lumbar MRI scan was obtained.  This was reviewed today with her.  She still in physical therapy has couple more visits and this was her second round of physical therapy. Patient's original date of injury was 10/13/2017 when she was working at [employer] and [employer] when she was lifting boxes and had onset of back pain.  She is been through 2 rounds of physical therapy taken anti-inflammatories.  MRI scan was obtained and is available for review which shows minimal facet  degenerative changes.  Normal disc hydration and no central foraminal or lateral recess stenosis.  Patient's on medication for hypertension and it is now significantly improved. Review of Systems   Objective: Vital Signs: BP 130/79   Pulse 90   Ht 5\' 6"  (1.676 m)   Wt 192 lb (87.1 kg)   BMI 30.99 kg/m   Physical Exam  Constitutional: She is oriented to person, place, and time. She appears well-developed.  HENT:  Head: Normocephalic.  Right Ear: External ear normal.  Left Ear: External ear normal.  Eyes: Pupils are equal, round, and reactive to light.  Neck: No tracheal deviation present. No thyromegaly present.  Cardiovascular: Normal rate.  Pulmonary/Chest: Effort normal.  Abdominal: Soft.  Neurological: She is alert and oriented to person, place, and time.  Skin: Skin is warm and dry.  Psychiatric: She has a normal mood and affect. Her behavior is normal.    Ortho Exam patient has no pitting edema no rash over exposed skin she is able to heel and toe walk.  Anterior tib gastrocsoleus is intact.  No quad atrophy.  Quads are strong.    Specialty Comments:  No specialty comments available.  Imaging: No results found.   PMFS History: Patient Active Problem List   Diagnosis Date Noted  . Essential  hypertension 06/24/2016  . Migraine headache 01/16/2011   Past Medical History:  Diagnosis Date  . Fibroids   . Hypertension   . Migraines     Family History  Problem Relation Age of Onset  . Hypertension Mother   . Hypertension Father     Past Surgical History:  Procedure Laterality Date  . CESAREAN SECTION     Social History   Occupational History  . Not on file  Tobacco Use  . Smoking status: Never Smoker  . Smokeless tobacco: Never Used  Substance and Sexual Activity  . Alcohol use: Not Currently  . Drug use: Never  . Sexual activity: Not on file

## 2018-04-15 ENCOUNTER — Telehealth (INDEPENDENT_AMBULATORY_CARE_PROVIDER_SITE_OTHER): Payer: Self-pay

## 2018-04-15 NOTE — Telephone Encounter (Signed)
-----   Message from Marybelle Killings, MD sent at 04/14/2018  9:46 AM EDT ----- Cc to W/C

## 2018-04-15 NOTE — Telephone Encounter (Signed)
Faxed the office note to wc adj Apolinar Junes [phone]

## 2018-04-16 ENCOUNTER — Telehealth (INDEPENDENT_AMBULATORY_CARE_PROVIDER_SITE_OTHER): Payer: Self-pay | Admitting: Orthopaedic Surgery

## 2018-04-16 NOTE — Telephone Encounter (Signed)
Fyi.

## 2018-04-16 NOTE — Telephone Encounter (Signed)
Elsie Saas called with [facility]) states he is faxing a form over to be completed releasing the patient from care. The number to contact Harrell Gave is [phone]

## 2018-04-24 ENCOUNTER — Telehealth: Payer: Self-pay

## 2018-04-24 DIAGNOSIS — I10 Essential (primary) hypertension: Secondary | ICD-10-CM

## 2018-04-24 MED ORDER — IBUPROFEN 800 MG PO TABS: ORAL_TABLET | ORAL | 1 refills | Status: DC

## 2018-04-24 MED ORDER — AMLODIPINE BESYLATE 10 MG PO TABS: 10.0000 mg | ORAL_TABLET | Freq: Every day | ORAL | 3 refills | Status: DC

## 2018-04-24 MED ORDER — HYDROCHLOROTHIAZIDE 25 MG PO TABS: 25.0000 mg | ORAL_TABLET | Freq: Every day | ORAL | 2 refills | Status: DC

## 2018-04-24 NOTE — Telephone Encounter (Signed)
Refills sent into pharmacy. Thanks!  

## 2018-04-29 ENCOUNTER — Telehealth (INDEPENDENT_AMBULATORY_CARE_PROVIDER_SITE_OTHER): Payer: Self-pay | Admitting: Orthopaedic Surgery

## 2018-04-29 NOTE — Telephone Encounter (Signed)
Patient called advised she spoke with Apolinar Junes and he advised her the 04/14/18 office note was not received. Patient said Gerald Stabs will be contacting Amy. The number to conact patient is [phone]

## 2018-04-30 NOTE — Telephone Encounter (Signed)
refaxed to [phone]

## 2018-05-02 ENCOUNTER — Telehealth (INDEPENDENT_AMBULATORY_CARE_PROVIDER_SITE_OTHER): Payer: Self-pay

## 2018-05-02 NOTE — Telephone Encounter (Signed)
Faxed completed 25 r back to wc adj Elsie Saas with 2% rating to the back marked. [phone]

## 2018-05-08 ENCOUNTER — Ambulatory Visit: Payer: Self-pay | Admitting: Internal Medicine

## 2018-05-19 ENCOUNTER — Encounter: Payer: Self-pay | Admitting: Family Medicine

## 2018-05-19 ENCOUNTER — Ambulatory Visit (INDEPENDENT_AMBULATORY_CARE_PROVIDER_SITE_OTHER): Payer: Self-pay | Admitting: Family Medicine

## 2018-05-19 VITALS — BP 138/86 | HR 97 | Temp 97.6°F | Resp 16 | Ht 66.0 in | Wt 200.0 lb

## 2018-05-19 DIAGNOSIS — I10 Essential (primary) hypertension: Secondary | ICD-10-CM

## 2018-05-19 DIAGNOSIS — J301 Allergic rhinitis due to pollen: Secondary | ICD-10-CM

## 2018-05-19 DIAGNOSIS — Z114 Encounter for screening for human immunodeficiency virus [HIV]: Secondary | ICD-10-CM

## 2018-05-19 DIAGNOSIS — E0590 Thyrotoxicosis, unspecified without thyrotoxic crisis or storm: Secondary | ICD-10-CM

## 2018-05-19 MED ORDER — FUROSEMIDE 20 MG PO TABS: 20.0000 mg | ORAL_TABLET | Freq: Every day | ORAL | 3 refills | Status: DC

## 2018-05-19 MED ORDER — METHIMAZOLE 10 MG PO TABS: 10.0000 mg | ORAL_TABLET | Freq: Two times a day (BID) | ORAL | 2 refills | Status: DC

## 2018-05-19 MED ORDER — LORATADINE 10 MG PO TABS: 10.0000 mg | ORAL_TABLET | Freq: Every day | ORAL | 11 refills | Status: DC

## 2018-05-19 MED ORDER — HYDROCHLOROTHIAZIDE 25 MG PO TABS: 25.0000 mg | ORAL_TABLET | Freq: Every day | ORAL | 2 refills | Status: DC

## 2018-05-19 NOTE — Patient Instructions (Addendum)
I am stopping the HCTZ and adding Lasix 56m to take every day.  I ordered a thyroid ultrasound and labs.    Nasal Allergies Nasal allergies are a reaction to allergens in the air. Allergens are tiny specks (particles) in the air that cause your body to have an allergic reaction. Nasal allergies are not passed from person to person (contagious). They cannot be cured, but they can be controlled. Common causes of nasal allergies include:  Pollen from grasses, trees, and weeds.  House dust mites.  Pet dander.  Mold.  Follow these instructions at home:  Avoid the allergen that is causing your symptoms, if you can.  Keep windows closed. If possible, use air conditioning when there is a lot of pollen in the air.  Do not use fans in your home.  Do not hang clothes outside to dry.  Wear sunglasses to keep pollen out of your eyes.  Wash your hands right away after you touch household pets.  Take over-the-counter and prescription medicines only as told by your doctor.  Keep all follow-up visits as told by your doctor. This is important. Contact a doctor if:  You have a fever.  You have a cough that does not go away (is persistent).  You start to make whistling sounds when you breathe (wheeze).  Your symptoms do not get better with treatment.  You have thick fluid coming from your nose.  You start to have nosebleeds. Get help right away if:  Your tongue or your lips are swollen.  You have trouble breathing.  You feel light-headed or you feel like you are going to pass out (faint).  You have cold sweats. This information is not intended to replace advice given to you by your health care provider. Make sure you discuss any questions you have with your health care provider. Document Released: 12/06/2010 Document Revised: 01/12/2016 Document Reviewed: 02/16/2015 Elsevier Interactive Patient Education  2018 [employer] Hyperthyroidism Hyperthyroidism is when the thyroid  is too active (overactive). Your thyroid is a large gland that is located in your neck. The thyroid helps to control how your body uses food (metabolism). When your thyroid is overactive, it produces too much of a hormone called thyroxine. What are the causes? Causes of hyperthyroidism may include:  Graves disease. This is when your immune system attacks the thyroid gland. This is the most common cause.  Inflammation of the thyroid gland.  Tumor in the thyroid gland or somewhere else.  Excessive use of thyroid medicines, including: ? Prescription thyroid supplement. ? Herbal supplements that mimic thyroid hormones.  Solid or fluid-filled lumps within your thyroid gland (thyroid nodules).  Excessive ingestion of iodine.  What increases the risk?  Being female.  Having a family history of thyroid conditions. What are the signs or symptoms? Signs and symptoms of hyperthyroidism may include:  Nervousness.  Inability to tolerate heat.  Unexplained weight loss.  Diarrhea.  Change in the texture of hair or skin.  Heart skipping beats or making extra beats.  Rapid heart rate.  Loss of menstruation.  Shaky hands.  Fatigue.  Restlessness.  Increased appetite.  Sleep problems.  Enlarged thyroid gland or nodules.  How is this diagnosed? Diagnosis of hyperthyroidism may include:  Medical history and physical exam.  Blood tests.  Ultrasound tests.  How is this treated? Treatment may include:  Medicines to control your thyroid.  Surgery to remove your thyroid.  Radiation therapy.  Follow these instructions at home:  Take medicines only as  directed by your health care provider.  Do not use any tobacco products, including cigarettes, chewing tobacco, or electronic cigarettes. If you need help quitting, ask your health care provider.  Do not exercise or do physical activity until your health care provider approves.  Keep all follow-up appointments as  directed by your health care provider. This is important. Contact a health care provider if:  Your symptoms do not get better with treatment.  You have fever.  You are taking thyroid replacement medicine and you: ? Have depression. ? Feel mentally and physically slow. ? Have weight gain. Get help right away if:  You have decreased alertness or a change in your awareness.  You have abdominal pain.  You feel dizzy.  You have a rapid heartbeat.  You have an irregular heartbeat. This information is not intended to replace advice given to you by your health care provider. Make sure you discuss any questions you have with your health care provider. Document Released: 08/06/2005 Document Revised: 01/05/2016 Document Reviewed: 12/22/2013 Elsevier Interactive Patient Education  2018 [employer].  Furosemide tablets What is this medicine? FUROSEMIDE (fyoor [place] se mide) is a diuretic. It helps you make more urine and to lose salt and excess water from your body. This medicine is used to treat high blood pressure, and edema or swelling from heart, kidney, or liver disease. This medicine may be used for other purposes; ask your health care provider or pharmacist if you have questions. COMMON BRAND NAME(S): Active-Medicated Specimen Kit, Delone, Diuscreen, Lasix, RX Specimen Collection Kit, Specimen Collection Kit, URINX Medicated Specimen Collection What should I tell my health care provider before I take this medicine? They need to know if you have any of these conditions: -abnormal blood electrolytes -diarrhea or vomiting -gout -heart disease -kidney disease, small amounts of urine, or difficulty passing urine -liver disease -thyroid disease -an unusual or allergic reaction to furosemide, sulfa drugs, other medicines, foods, dyes, or preservatives -pregnant or trying to get pregnant -breast-feeding How should I use this medicine? Take this medicine by mouth with a glass of water.  Follow the directions on the prescription label. You may take this medicine with or without food. If it upsets your stomach, take it with food or milk. Do not take your medicine more often than directed. Remember that you will need to pass more urine after taking this medicine. Do not take your medicine at a time of day that will cause you problems. Do not take at bedtime. Talk to your pediatrician regarding the use of this medicine in children. While this drug may be prescribed for selected conditions, precautions do apply. Overdosage: If you think you have taken too much of this medicine contact a poison control center or emergency room at once. NOTE: This medicine is only for you. Do not share this medicine with others. What if I miss a dose? If you miss a dose, take it as soon as you can. If it is almost time for your next dose, take only that dose. Do not take double or extra doses. What may interact with this medicine? -aspirin and aspirin-like medicines -certain antibiotics -chloral hydrate -cisplatin -cyclosporine -digoxin -diuretics -laxatives -lithium -medicines for blood pressure -medicines that relax muscles for surgery -methotrexate -NSAIDs, medicines for pain and inflammation like ibuprofen, naproxen, or indomethacin -phenytoin -steroid medicines like prednisone or cortisone -sucralfate -thyroid hormones This list may not describe all possible interactions. Give your health care provider a list of all the medicines, herbs, non-prescription drugs,  or dietary supplements you use. Also tell them if you smoke, drink alcohol, or use illegal drugs. Some items may interact with your medicine. What should I watch for while using this medicine? Visit your doctor or health care professional for regular checks on your progress. Check your blood pressure regularly. Ask your doctor or health care professional what your blood pressure should be, and when you should contact him or her. If  you are a diabetic, check your blood sugar as directed. You may need to be on a special diet while taking this medicine. Check with your doctor. Also, ask how many glasses of fluid you need to drink a day. You must not get dehydrated. You may get drowsy or dizzy. Do not drive, use machinery, or do anything that needs mental alertness until you know how this drug affects you. Do not stand or sit up quickly, especially if you are an older patient. This reduces the risk of dizzy or fainting spells. Alcohol can make you more drowsy and dizzy. Avoid alcoholic drinks. This medicine can make you more sensitive to the sun. Keep out of the sun. If you cannot avoid being in the sun, wear protective clothing and use sunscreen. Do not use sun lamps or tanning beds/booths. What side effects may I notice from receiving this medicine? Side effects that you should report to your doctor or health care professional as soon as possible: -blood in urine or stools -dry mouth -fever or chills -hearing loss or ringing in the ears -irregular heartbeat -muscle pain or weakness, cramps -skin rash -stomach upset, pain, or nausea -tingling or numbness in the hands or feet -unusually weak or tired -vomiting or diarrhea -yellowing of the eyes or skin Side effects that usually do not require medical attention (report to your doctor or health care professional if they continue or are bothersome): -headache -loss of appetite -unusual bleeding or bruising This list may not describe all possible side effects. Call your doctor for medical advice about side effects. You may report side effects to FDA at 1-800-FDA-1088. Where should I keep my medicine? Keep out of the reach of children. Store at room temperature between 15 and 30 degrees C (59 and 86 degrees F). Protect from light. Throw away any unused medicine after the expiration date. NOTE: This sheet is a summary. It may not cover all possible information. If you have  questions about this medicine, talk to your doctor, pharmacist, or health care provider.  2018 Elsevier/Gold Standard (2014-10-27 13:49:50)

## 2018-05-19 NOTE — Progress Notes (Signed)
[facility]  Chronic Disease Follow Up Provider: Lanae Boast, FNP  SUBJECTIVE:  Patient presents for follow up for the following  chronic conditions.    Hypertension Patient reports taking her medication daily. She is not keeping a log of blood pressures as instructed by cardiology. She states that she is not exercising or following a sodium restricted diet. Denies CP, SOB or leg edema. She would like to d/c HCTZ due to seeing a recall notification on social media.   Hyperthyroid  She is doing well on medications. She is unable to see endocrinology due to lack of insurance. She is not eligible for the [facility] discount. Patient states that she is working on getting coverage.   Skin Discoloration  Patient states that she is noticing an increase in the dark circles under bilateral eyes. She is not taking the allergy medication that was prescribed for this at her last visit.  Review of Systems  Constitutional: Negative.   HENT: Negative.   Eyes: Negative.   Respiratory: Negative.   Cardiovascular: Negative.   Gastrointestinal: Negative.   Genitourinary: Negative.   Musculoskeletal: Negative.   Skin: Negative.        Allergic shiners bilaterally  Neurological: Negative.   Psychiatric/Behavioral: Negative.     OBJECTIVE:  BP 138/86 (BP Location: Right Arm, Patient Position: Sitting, Cuff Size: Large)   Pulse 97   Temp 97.6 F (36.4 C) (Oral)   Resp 16   Ht 5\' 6"  (1.676 m)   Wt 200 lb (90.7 kg)   LMP 04/28/2018   SpO2 100%   BMI 32.28 kg/m   Physical Exam  Constitutional: She is oriented to person, place, and time and well-developed, well-nourished, and in no distress. No distress.  HENT:  Head: Normocephalic and atraumatic.  Eyes: Pupils are equal, round, and reactive to light. Conjunctivae and EOM are normal.  Neck: Normal range of motion. Neck supple.  Cardiovascular: Normal rate, regular rhythm and intact distal  pulses. Exam reveals no gallop and no friction rub.  No murmur heard. Pulmonary/Chest: Effort normal and breath sounds normal. No respiratory distress. She has no wheezes.  Abdominal: Soft. Bowel sounds are normal. There is no tenderness.  Musculoskeletal: Normal range of motion. She exhibits no edema or tenderness.  Lymphadenopathy:    She has no cervical adenopathy.  Neurological: She is alert and oriented to person, place, and time. Gait normal.  Skin: Skin is warm and dry.  Mild hyperpigmentation noted to the bilateral eyes under the lower lids.   Psychiatric: Mood, memory, affect and judgment normal.  Nursing note and vitals reviewed.    ASSESSMENT/PLAN:  1. Essential hypertension The current medical regimen is effective;  continue present plan and medications. The patient is asked to make an attempt to improve diet and exercise patterns to aid in medical management of this problem. - furosemide (LASIX) 20 MG tablet; Take 1 tablet (20 mg total) by mouth daily.  Dispense: 30 tablet; Refill: 3 - Comprehensive metabolic panel Changed from HCTZ to lasix. Discussed monitoring her potassium  2. Hyperthyroidism Pending labs. Will adjust medications accordingly.  Thyroid u/s ordered.  - Thyroid Panel With TSH - methimazole (TAPAZOLE) 10 MG tablet; Take 1 tablet (10 mg total) by mouth 2 (two) times daily.  Dispense: 60 tablet; Refill: 2 - Thyroid Peroxidase Antibody - Thyroid antibodies - US Soft Tissue Head/Neck; Future  3. Allergic rhinitis due to pollen, unspecified seasonality flonase and loratidine  4. Encounter for  screening for HIV Health maintenance - HIV antibody (with reflex)     Return to care as scheduled and prn. Patient verbalized understanding and agreed with plan of care.   [name], FNP-BC Patient [place] [place], [place] [phone]    The 2018 Physical Activity Guidelines recommend  the equivalent of 150 minutes per week of moderate to vigorous aerobic activity each week, with muscle-strengthening activities on two days during the week    PRESCRIPTION FOR EXERCISE  Frequency Four to five days per week  Intensity Moderate- During moderate intensity exercise, a person is too winded to sing but is not so winded they cannot talk  Time 30 minutes or a total of 150 minutes per week.   Type Brisk walking PLUS One set each of: 0 Body weight squats  10 0 Plank hold for 30 seconds   Basic bodyweight exercise guide: Squat and plank  (A) Body weight squat: 0 The squat develops strength in the legs and torso. Stand with your feet about shoulder's width apart and slightly externally rotated. Maintain your weight on your heels or midfoot throughout the exercise. Keep your lower back flat; do not allow it to round or to extend excessively. Your knees should remain aligned with your ankles and legs throughout the motion (knees caving inward towards one another is a common problem). Descend until your hips come just below the level of your knees. If strength or mobility limitations prevent you from reaching this depth, begin with partial squats and gradually increase the depth over time. Once you can perform 15 squats with proper technique and full depth, make the exercise more challenging by holding a weight. (B) Standard plank: 0 The basic plank develops torso (core) strength. Maintain a straight torso, not allowing your lower back to sag or your hips to elevate, throughout the exercise. Gradually increase the time you can hold the position.

## 2018-05-20 LAB — THYROID ANTIBODIES
Thyroglobulin Antibody: <1 IU/mL (ref 0.0–0.9)
Thyroperoxidase Ab SerPl-aCnc: 382 IU/mL — ABNORMAL HIGH (ref 0–34)

## 2018-05-20 LAB — COMPREHENSIVE METABOLIC PANEL
ALT: 14 IU/L (ref 0–32)
AST: 17 IU/L (ref 0–40)
Albumin/Globulin Ratio: 1.4 (ref 1.2–2.2)
Albumin: 4.6 g/dL (ref 3.5–5.5)
Alkaline Phosphatase: 70 IU/L (ref 39–117)
BUN/Creatinine Ratio: 19 (ref 9–23)
BUN: 12 mg/dL (ref 6–24)
Bilirubin Total: 0.3 mg/dL (ref 0.0–1.2)
CO2: 21 mmol/L (ref 20–29)
Calcium: 9.4 mg/dL (ref 8.7–10.2)
Chloride: 97 mmol/L (ref 96–106)
Creatinine, Ser: 0.62 mg/dL (ref 0.57–1.00)
GFR calc Af Amer: 123 mL/min/{1.73_m2} (ref >59)
GFR calc non Af Amer: 107 mL/min/{1.73_m2} (ref >59)
Globulin, Total: 3.2 g/dL (ref 1.5–4.5)
Glucose: 126 mg/dL — ABNORMAL HIGH (ref 65–99)
Potassium: 3.9 mmol/L (ref 3.5–5.2)
Sodium: 137 mmol/L (ref 134–144)
Total Protein: 7.8 g/dL (ref 6.0–8.5)

## 2018-05-20 LAB — THYROID PANEL WITH TSH
Free Thyroxine Index: 6.4 — ABNORMAL HIGH (ref 1.2–4.9)
T3 Uptake Ratio: 55 % — ABNORMAL HIGH (ref 24–39)
T4, Total: 11.6 ug/dL (ref 4.5–12.0)
TSH: 0.007 u[IU]/mL — ABNORMAL LOW (ref 0.450–4.500)

## 2018-05-20 LAB — HIV ANTIBODY (ROUTINE TESTING W REFLEX): HIV Screen 4th Generation wRfx: NONREACTIVE

## 2018-05-22 ENCOUNTER — Other Ambulatory Visit: Payer: Self-pay | Admitting: Family Medicine

## 2018-05-22 DIAGNOSIS — E0590 Thyrotoxicosis, unspecified without thyrotoxic crisis or storm: Secondary | ICD-10-CM

## 2018-05-27 ENCOUNTER — Ambulatory Visit (HOSPITAL_COMMUNITY): Payer: Medicaid Other

## 2018-05-27 ENCOUNTER — Encounter: Payer: Self-pay | Admitting: Internal Medicine

## 2018-05-29 ENCOUNTER — Ambulatory Visit (HOSPITAL_COMMUNITY): Payer: Medicaid Other | Attending: Family Medicine

## 2018-06-03 ENCOUNTER — Telehealth (INDEPENDENT_AMBULATORY_CARE_PROVIDER_SITE_OTHER): Payer: Self-pay | Admitting: Orthopaedic Surgery

## 2018-06-03 NOTE — Telephone Encounter (Signed)
Records re faxed at[facility] office [phone]. Received vm from [facility] stating they did not receive records. I originally faxed 05/22/18

## 2018-07-07 ENCOUNTER — Telehealth: Payer: Self-pay

## 2018-07-08 MED ORDER — IBUPROFEN 800 MG PO TABS: ORAL_TABLET | ORAL | 1 refills | Status: DC

## 2018-07-08 NOTE — Telephone Encounter (Signed)
Refill for ibuprofen sent into pharmacy. Thanks!

## 2018-08-18 ENCOUNTER — Ambulatory Visit: Payer: Medicaid Other | Admitting: Family Medicine

## 2018-08-25 ENCOUNTER — Ambulatory Visit: Payer: Medicaid Other | Admitting: Family Medicine

## 2018-09-03 ENCOUNTER — Ambulatory Visit: Payer: Medicaid Other | Admitting: Family Medicine

## 2018-11-21 ENCOUNTER — Telehealth: Payer: Self-pay

## 2018-11-21 DIAGNOSIS — I10 Essential (primary) hypertension: Secondary | ICD-10-CM

## 2018-11-26 MED ORDER — AMLODIPINE BESYLATE 10 MG PO TABS: 10.0000 mg | ORAL_TABLET | Freq: Every day | ORAL | 3 refills | Status: DC

## 2018-11-26 MED ORDER — HYDROCHLOROTHIAZIDE 25 MG PO TABS: 25.0000 mg | ORAL_TABLET | Freq: Every day | ORAL | 1 refills | Status: DC

## 2018-12-16 NOTE — Telephone Encounter (Signed)
Message sent to provider 

## 2019-01-08 ENCOUNTER — Ambulatory Visit: Payer: Medicaid Other | Admitting: Family Medicine

## 2019-01-15 ENCOUNTER — Telehealth: Payer: Self-pay

## 2019-01-15 NOTE — Telephone Encounter (Signed)
Called to do COVID-19 screening for appointment tomorrow. No answer and no voicemail. Thanks!

## 2019-01-16 ENCOUNTER — Ambulatory Visit: Payer: Medicaid Other | Admitting: Family Medicine

## 2019-01-19 ENCOUNTER — Ambulatory Visit: Payer: Medicaid Other | Admitting: Family Medicine

## 2019-01-21 ENCOUNTER — Telehealth: Payer: Self-pay

## 2019-01-21 DIAGNOSIS — I10 Essential (primary) hypertension: Secondary | ICD-10-CM

## 2019-01-21 MED ORDER — IBUPROFEN 800 MG PO TABS: ORAL_TABLET | ORAL | 1 refills | Status: DC

## 2019-01-21 MED ORDER — HYDROCHLOROTHIAZIDE 25 MG PO TABS: 25.0000 mg | ORAL_TABLET | Freq: Every day | ORAL | 1 refills | Status: DC

## 2019-01-21 MED ORDER — AMLODIPINE BESYLATE 10 MG PO TABS: 10.0000 mg | ORAL_TABLET | Freq: Every day | ORAL | 3 refills | Status: DC

## 2019-01-21 NOTE — Telephone Encounter (Signed)
Refills sent into pharmacy. Thanks!  

## 2019-04-01 ENCOUNTER — Other Ambulatory Visit: Payer: Self-pay

## 2019-04-01 ENCOUNTER — Ambulatory Visit (INDEPENDENT_AMBULATORY_CARE_PROVIDER_SITE_OTHER): Payer: Medicaid Other | Admitting: Family Medicine

## 2019-04-01 ENCOUNTER — Encounter: Payer: Self-pay | Admitting: Family Medicine

## 2019-04-01 VITALS — BP 129/83 | HR 91 | Temp 98.2°F | Resp 14 | Ht 66.0 in | Wt 211.0 lb

## 2019-04-01 DIAGNOSIS — N920 Excessive and frequent menstruation with regular cycle: Secondary | ICD-10-CM

## 2019-04-01 DIAGNOSIS — I10 Essential (primary) hypertension: Secondary | ICD-10-CM

## 2019-04-01 DIAGNOSIS — D509 Iron deficiency anemia, unspecified: Secondary | ICD-10-CM

## 2019-04-01 DIAGNOSIS — E0590 Thyrotoxicosis, unspecified without thyrotoxic crisis or storm: Secondary | ICD-10-CM

## 2019-04-01 DIAGNOSIS — Z86018 Personal history of other benign neoplasm: Secondary | ICD-10-CM

## 2019-04-01 LAB — POCT URINALYSIS DIPSTICK
Bilirubin, UA: NEGATIVE
Blood, UA: NEGATIVE
Glucose, UA: NEGATIVE
Ketones, UA: NEGATIVE
Leukocytes, UA: NEGATIVE
Nitrite, UA: NEGATIVE
Protein, UA: POSITIVE — AB
Spec Grav, UA: 1.02 (ref 1.010–1.025)
Urobilinogen, UA: 1 E.U./dL
pH, UA: 5.5 (ref 5.0–8.0)

## 2019-04-01 MED ORDER — METHIMAZOLE 10 MG PO TABS: 10.0000 mg | ORAL_TABLET | Freq: Two times a day (BID) | ORAL | 2 refills | Status: DC

## 2019-04-01 NOTE — Progress Notes (Signed)
Patient [facility]   Progress Note: General Provider: Lanae Boast, FNP  SUBJECTIVE:   Susan Mathis is a 48 y.o. female who  has a past medical history of Fibroids, Hypertension, and Migraines.. Patient presents today for Hypertension, Abdominal Pain, and Menometrorrhagia  Patient states that she has a history of fibroids. She states that she is having abdominal pain and heavy bleeding. She would like to know if this is due to her fibroids. She would like to discuss having a partial hysterectomy.  She also has a history of hypertension and hyperthyroidism. She has not been taking tapazole. She has not been able to see endocrinology due to lack of insurance Has not been see at this clinic in 9 months.   Review of Systems  Constitutional: Negative.   HENT: Negative.   Eyes: Negative.   Respiratory: Negative.   Cardiovascular: Negative.   Gastrointestinal: Negative.   Genitourinary: Negative.        Heavy menstruation.    Musculoskeletal: Negative.   Skin: Negative.   Neurological: Negative.   Psychiatric/Behavioral: Negative.      OBJECTIVE: BP 129/83 (BP Location: Left Arm, Patient Position: Sitting, Cuff Size: Normal)   Pulse 91   Temp 98.2 F (36.8 C) (Oral)   Resp 14   Ht 5\' 6"  (1.676 m)   Wt 211 lb (95.7 kg)   LMP 03/11/2019   SpO2 100%   BMI 34.06 kg/m   Wt Readings from Last 3 Encounters:  04/01/19 211 lb (95.7 kg)  05/19/18 200 lb (90.7 kg)  04/14/18 192 lb (87.1 kg)     Physical Exam Vitals signs and nursing note reviewed.  Constitutional:      General: She is not in acute distress.    Appearance: Normal appearance.  HENT:     Head: Normocephalic and atraumatic.  Eyes:     Extraocular Movements: Extraocular movements intact.     Conjunctiva/sclera: Conjunctivae normal.     Pupils: Pupils are equal, round, and reactive to light.  Cardiovascular:     Rate and Rhythm: Normal rate and regular rhythm.   Heart sounds: No murmur.  Pulmonary:     Effort: Pulmonary effort is normal.     Breath sounds: Normal breath sounds.  Musculoskeletal: Normal range of motion.  Skin:    General: Skin is warm and dry.  Neurological:     Mental Status: She is alert and oriented to person, place, and time.  Psychiatric:        Mood and Affect: Mood normal.        Behavior: Behavior normal.        Thought Content: Thought content normal.        Judgment: Judgment normal.     ASSESSMENT/PLAN:  1. History of uterine fibroid - Ambulatory referral to Gynecology - CBC with Differential  2. Menorrhagia with regular cycle - CBC with Differential  3. Essential hypertension - Urinalysis Dipstick - Comprehensive metabolic panel  4. Hyperthyroidism - Thyroid Panel With TSH - methimazole (TAPAZOLE) 10 MG tablet; Take 1 tablet (10 mg total) by mouth 2 (two) times daily.  Dispense: 60 tablet; Refill: 2  5. Iron deficiency anemia, unspecified iron deficiency anemia type - Iron, TIBC and Ferritin Panel    Return in about 6 months (around 10/02/2019) for HTN/Hyperthyroidism.    The patient was given clear instructions to go to ER or return to medical center if symptoms do not improve, worsen or new problems develop. The  patient verbalized understanding and agreed with plan of care.   Susan Mathis. Susan Canary, FNP-BC Patient [facility] [address] [place],  [phone]

## 2019-04-01 NOTE — Patient Instructions (Signed)
Hyperthyroidism  Hyperthyroidism is when the thyroid gland is too active (overactive). The thyroid gland is a small gland located in the lower front part of the neck, just in front of the windpipe (trachea). This gland makes hormones that help control how the body uses food for energy (metabolism) as well as how the heart and brain function. These hormones also play a role in keeping your bones strong. When the thyroid is overactive, it produces too much of a hormone called thyroxine. What are the causes? This condition may be caused by:  Graves' disease. This is a disorder in which the body's disease-fighting system (immune system) attacks the thyroid gland. This is the most common cause.  Inflammation of the thyroid gland.  A tumor in the thyroid gland.  Use of certain medicines, including: ? Prescription thyroid hormone replacement. ? Herbal supplements that mimic thyroid hormones. ? Amiodarone therapy.  Solid or fluid-filled lumps within your thyroid gland (thyroid nodules).  Taking in a large amount of iodine from foods or medicines. What increases the risk? You are more likely to develop this condition if:  You are female.  You have a family history of thyroid conditions.  You smoke tobacco.  You use a medicine called lithium.  You take medicines that affect the immune system (immunosuppressants). What are the signs or symptoms? Symptoms of this condition include:  Nervousness.  Inability to tolerate heat.  Unexplained weight loss.  Diarrhea.  Change in the texture of hair or skin.  Heart skipping beats or making extra beats.  Rapid heart rate.  Loss of menstruation.  Shaky hands.  Fatigue.  Restlessness.  Sleep problems.  Enlarged thyroid gland or a lump in the thyroid (nodule). You may also have symptoms of Graves' disease, which may include:  Protruding eyes.  Dry eyes.  Red or swollen eyes.  Problems with vision. How is this diagnosed?  This condition may be diagnosed based on:  Your symptoms and medical history.  A physical exam.  Blood tests.  Thyroid ultrasound. This test involves using sound waves to produce images of the thyroid gland.  A thyroid scan. A radioactive substance is injected into a vein, and images show how much iodine is present in the thyroid.  Radioactive iodine uptake test (RAIU). A small amount of radioactive iodine is given by mouth to see how much iodine the thyroid absorbs after a certain amount of time. How is this treated? Treatment depends on the cause and severity of the condition. Treatment may include:  Medicines to reduce the amount of thyroid hormone your body makes.  Radioactive iodine treatment (radioiodine therapy). This involves swallowing a small dose of radioactive iodine, in capsule or liquid form, to kill thyroid cells.  Surgery to remove part or all of your thyroid gland. You may need to take thyroid hormone replacement medicine for the rest of your life after thyroid surgery.  Medicines to help manage your symptoms. Follow these instructions at home:   Take over-the-counter and prescription medicines only as told by your health care provider.  Do not use any products that contain nicotine or tobacco, such as cigarettes and e-cigarettes. If you need help quitting, ask your health care provider.  Follow any instructions from your health care provider about diet. You may be instructed to limit foods that contain iodine.  Keep all follow-up visits as told by your health care provider. This is important. ? You will need to have blood tests regularly so that your health care provider can   monitor your condition. Contact a health care provider if:  Your symptoms do not get better with treatment.  You have a fever.  You are taking thyroid hormone replacement medicine and you: ? Have symptoms of depression. ? Feel like you are tired all the time. ? Gain weight. Get help  right away if:  You have chest pain.  You have decreased alertness or a change in your awareness.  You have abdominal pain.  You feel dizzy.  You have a rapid heartbeat.  You have an irregular heartbeat.  You have difficulty breathing. Summary  The thyroid gland is a small gland located in the lower front part of the neck, just in front of the windpipe (trachea).  Hyperthyroidism is when the thyroid gland is too active (overactive) and produces too much of a hormone called thyroxine.  The most common cause is Graves' disease, a disorder in which your immune system attacks the thyroid gland.  Hyperthyroidism can cause various symptoms, such as unexplained weight loss, nervousness, inability to tolerate heat, or changes in your heartbeat.  Treatment may include medicine to reduce the amount of thyroid hormone your body makes, radioiodine therapy, surgery, or medicines to manage symptoms. This information is not intended to replace advice given to you by your health care provider. Make sure you discuss any questions you have with your health care provider. Document Released: 08/06/2005 Document Revised: 07/19/2017 Document Reviewed: 07/17/2017 Elsevier Patient Education  2020 [employer]. Uterine Fibroids  Uterine fibroids are lumps of tissue (tumors) in your womb (uterus). They are not cancer (are benign). Most women with this condition do not need treatment. Sometimes fibroids can affect your ability to have children (your fertility). If that happens, you may need surgery to take out the fibroids. Follow these instructions at home:  Take over-the-counter and prescription medicines only as told by your doctor. Your doctor may suggest NSAIDs (such as aspirin or ibuprofen) to help with pain.  Ask your doctor if you should: ? Take iron pills. ? Eat more foods that have iron in them, such as dark green, leafy vegetables.  If directed, apply heat to your back or belly to reduce  pain. Use the heat source that your doctor recommends, such as a moist heat pack or a heating pad. ? Put a towel between your skin and the heat source. ? Leave the heat on for 20-30 minutes. ? Remove the heat if your skin turns bright red. This is especially important if you are unable to feel pain, heat, or cold. You may have a greater risk of getting burned.  Pay close attention to your period (menstrual) cycles. Tell your doctor about any changes, such as: ? A heavier blood flow than usual. ? Needing to use more pads or tampons than normal. ? A change in how many days your period lasts. ? A change in symptoms that come with your period, such as cramps or back pain.  Keep all follow-up visits as told by your doctor. This is important. Your doctor may need to watch your fibroids over time for any changes. Contact a doctor if you:  Have pain that does not get better with medicine or heat, such as pain or cramps in: ? Your back. ? The area between your hip bones (pelvic area). ? Your belly.  Have new bleeding between your periods.  Have more bleeding during or between your periods.  Feel very tired or weak.  Feel light-headed. Get help right away if you:  Pass out (faint).  Have pain in the area between your hip bones that suddenly gets worse.  Have bleeding that soaks a tampon or pad in 30 minutes or less. Summary  Uterine fibroids are lumps of tissue (tumors) in your womb (uterus). They are not cancer.  The only treatment that most women need is taking aspirin or ibuprofen for pain.  Contact a doctor if you have pain or cramps that do not get better with medicine.  Make sure you know what symptoms you should get help for right away. This information is not intended to replace advice given to you by your health care provider. Make sure you discuss any questions you have with your health care provider. Document Released: 09/08/2010 Document Revised: 07/19/2017 Document  Reviewed: 07/02/2017 Elsevier Patient Education  2020 [employer].

## 2019-04-03 ENCOUNTER — Telehealth: Payer: Self-pay

## 2019-04-03 LAB — CBC WITH DIFFERENTIAL/PLATELET
Basophils Absolute: 0 10*3/uL (ref 0.0–0.2)
Basos: 0 %
EOS (ABSOLUTE): 0.2 10*3/uL (ref 0.0–0.4)
Eos: 3 %
Hematocrit: 26.9 % — ABNORMAL LOW (ref 34.0–46.6)
Hemoglobin: 7.4 g/dL — ABNORMAL LOW (ref 11.1–15.9)
Immature Grans (Abs): 0 10*3/uL (ref 0.0–0.1)
Immature Granulocytes: 0 %
Lymphocytes Absolute: 3 10*3/uL (ref 0.7–3.1)
Lymphs: 33 %
MCH: 16.5 pg — ABNORMAL LOW (ref 26.6–33.0)
MCHC: 27.5 g/dL — ABNORMAL LOW (ref 31.5–35.7)
MCV: 60 fL — ABNORMAL LOW (ref 79–97)
Monocytes Absolute: 0.8 10*3/uL (ref 0.1–0.9)
Monocytes: 9 %
Neutrophils Absolute: 5 10*3/uL (ref 1.4–7.0)
Neutrophils: 55 %
Platelets: 327 10*3/uL (ref 150–450)
RBC: 4.48 x10E6/uL (ref 3.77–5.28)
RDW: 17.8 % — ABNORMAL HIGH (ref 11.7–15.4)
WBC: 9 10*3/uL (ref 3.4–10.8)

## 2019-04-03 LAB — COMPREHENSIVE METABOLIC PANEL
ALT: 10 IU/L (ref 0–32)
AST: 12 IU/L (ref 0–40)
Albumin/Globulin Ratio: 1.3 (ref 1.2–2.2)
Albumin: 4.5 g/dL (ref 3.8–4.8)
Alkaline Phosphatase: 56 IU/L (ref 39–117)
BUN/Creatinine Ratio: 20 (ref 9–23)
BUN: 13 mg/dL (ref 6–24)
Bilirubin Total: 0.3 mg/dL (ref 0.0–1.2)
CO2: 22 mmol/L (ref 20–29)
Calcium: 10.2 mg/dL (ref 8.7–10.2)
Chloride: 95 mmol/L — ABNORMAL LOW (ref 96–106)
Creatinine, Ser: 0.64 mg/dL (ref 0.57–1.00)
GFR calc Af Amer: 121 mL/min/{1.73_m2} (ref >59)
GFR calc non Af Amer: 105 mL/min/{1.73_m2} (ref >59)
Globulin, Total: 3.6 g/dL (ref 1.5–4.5)
Glucose: 126 mg/dL — ABNORMAL HIGH (ref 65–99)
Potassium: 3.9 mmol/L (ref 3.5–5.2)
Sodium: 137 mmol/L (ref 134–144)
Total Protein: 8.1 g/dL (ref 6.0–8.5)

## 2019-04-03 LAB — IRON,TIBC AND FERRITIN PANEL
Ferritin: 3 ng/mL — ABNORMAL LOW (ref 15–150)
Iron Saturation: 3 % — CL (ref 15–55)
Iron: 17 ug/dL — ABNORMAL LOW (ref 27–159)
Total Iron Binding Capacity: 509 ug/dL — ABNORMAL HIGH (ref 250–450)
UIBC: 492 ug/dL — ABNORMAL HIGH (ref 131–425)

## 2019-04-03 LAB — THYROID PANEL WITH TSH
Free Thyroxine Index: 3.2 (ref 1.2–4.9)
T3 Uptake Ratio: 31 % (ref 24–39)
T4, Total: 10.3 ug/dL (ref 4.5–12.0)
TSH: 2.78 u[IU]/mL (ref 0.450–4.500)

## 2019-04-03 NOTE — Telephone Encounter (Signed)
-----   Message from Lanae Boast, [place] sent at 04/03/2019  4:09 PM EDT ----- Please let patient know that her iron levels are low. I would like her to have an iron infusion called fereheme. She will need to call and schedule with the infusion center.

## 2019-04-03 NOTE — Telephone Encounter (Signed)
Called and spoke with patient, advised that iron levels are low and she needs to come in for iron transfusion. She was transferred to front desk to schedule an appointment. Thanks !

## 2019-04-03 NOTE — Progress Notes (Signed)
Please let patient know that her iron levels are low. I would like her to have an iron infusion called fereheme. She will need to call and schedule with the infusion center.

## 2019-04-09 ENCOUNTER — Other Ambulatory Visit: Payer: Self-pay | Admitting: Family Medicine

## 2019-04-09 ENCOUNTER — Encounter (HOSPITAL_COMMUNITY): Payer: Medicaid Other

## 2019-04-09 NOTE — Progress Notes (Signed)
Orders placed for fereheme infusion.

## 2019-04-29 ENCOUNTER — Encounter (HOSPITAL_COMMUNITY): Payer: Self-pay

## 2019-04-29 ENCOUNTER — Encounter (HOSPITAL_COMMUNITY): Payer: Self-pay | Admitting: *Deleted

## 2019-05-20 ENCOUNTER — Other Ambulatory Visit: Payer: Self-pay

## 2019-05-20 DIAGNOSIS — I10 Essential (primary) hypertension: Secondary | ICD-10-CM

## 2019-05-20 MED ORDER — HYDROCHLOROTHIAZIDE 25 MG PO TABS: 25.0000 mg | ORAL_TABLET | Freq: Every day | ORAL | 3 refills | Status: DC

## 2019-05-20 MED ORDER — AMLODIPINE BESYLATE 10 MG PO TABS: 10.0000 mg | ORAL_TABLET | Freq: Every day | ORAL | 3 refills | Status: DC

## 2019-06-26 ENCOUNTER — Ambulatory Visit (INDEPENDENT_AMBULATORY_CARE_PROVIDER_SITE_OTHER): Payer: Medicaid Other | Admitting: Obstetrics & Gynecology

## 2019-06-26 ENCOUNTER — Other Ambulatory Visit: Payer: Self-pay

## 2019-06-26 ENCOUNTER — Other Ambulatory Visit (HOSPITAL_COMMUNITY)
Admission: RE | Admit: 2019-06-26 | Discharge: 2019-06-26 | Disposition: A | Discharge status: Home / Self Care | Payer: Medicaid Other | Source: Ambulatory Visit | Attending: Obstetrics & Gynecology | Admitting: Obstetrics & Gynecology

## 2019-06-26 ENCOUNTER — Encounter: Payer: Self-pay | Admitting: Obstetrics & Gynecology

## 2019-06-26 VITALS — BP 136/84 | HR 104 | Wt 208.0 lb

## 2019-06-26 DIAGNOSIS — Z0000 Encounter for general adult medical examination without abnormal findings: Secondary | ICD-10-CM

## 2019-06-26 DIAGNOSIS — N939 Abnormal uterine and vaginal bleeding, unspecified: Secondary | ICD-10-CM

## 2019-06-26 DIAGNOSIS — Z3009 Encounter for other general counseling and advice on contraception: Secondary | ICD-10-CM

## 2019-06-26 DIAGNOSIS — R87810 Cervical high risk human papillomavirus (HPV) DNA test positive: Secondary | ICD-10-CM | POA: Insufficient documentation

## 2019-06-26 DIAGNOSIS — Z01419 Encounter for gynecological examination (general) (routine) without abnormal findings: Secondary | ICD-10-CM | POA: Insufficient documentation

## 2019-06-26 NOTE — Progress Notes (Signed)
GYNECOLOGY ANNUAL PREVENTATIVE CARE ENCOUNTER NOTE  History:     Susan Mathis is a 48 y.o.  female here for a routine annual gynecologic exam.  Current complaints: abnormal uterine bleeding x 2 weeks in September.  Periods have become irregular in the last few years.  No recent paps.  Not using anything for birth control now, used Depo Provera in the past but this caused a lot of breakthrough bleeding.  Wants to discuss other options.  Denies current abnormal vaginal bleeding, discharge, pelvic pain, problems with intercourse or other gynecologic concerns.    Gynecologic History No LMP recorded. Contraception: none Last Pap: Many years ago. Results were: normal  Last mammogram: Many years ago. Results were: normal  Obstetric History OB History  No obstetric history on file.    Past Medical History:  Diagnosis Date  . Fibroids   . Hypertension   . Migraines     Past Surgical History:  Procedure Laterality Date  . CESAREAN SECTION      Current Outpatient Medications on File Prior to Visit  Medication Sig Dispense Refill  . amLODipine (NORVASC) 10 MG tablet Take 1 tablet (10 mg total) by mouth daily. 90 tablet 3  . Aspirin-Acetaminophen-Caffeine (GOODY HEADACHE PO) Take by mouth.    . hydrochlorothiazide (HYDRODIURIL) 25 MG tablet Take 1 tablet (25 mg total) by mouth daily. 90 tablet 3  . ibuprofen (ADVIL) 800 MG tablet TAKE 1 TABLET BY MOUTH TWICE DAILY WITH MEALS AS NEEDED FOR PAIN 30 tablet 1  . loratadine (CLARITIN) 10 MG tablet Take 1 tablet (10 mg total) by mouth daily. (Patient not taking: Reported on 04/01/2019) 30 tablet 11  . methimazole (TAPAZOLE) 10 MG tablet Take 1 tablet (10 mg total) by mouth 2 (two) times daily. (Patient not taking: Reported on 06/26/2019) 60 tablet 2   No current facility-administered medications on file prior to visit.     No Known Allergies  Social History:  reports that she has never smoked. She has never used smokeless tobacco. She  reports previous alcohol use. She reports that she does not use drugs.  Family History  Problem Relation Age of Onset  . Hypertension Mother   . Hypertension Father     The following portions of the patient's history were reviewed and updated as appropriate: allergies, current medications, past family history, past medical history, past social history, past surgical history and problem list.  Review of Systems Pertinent items noted in HPI and remainder of comprehensive ROS otherwise negative.  Physical Exam:  BP 136/84   Pulse (!) 104   Wt 208 lb (94.3 kg)   BMI 33.57 kg/m  CONSTITUTIONAL: Well-developed, well-nourished female in no acute distress.  HENT:  Normocephalic, atraumatic, External right and left ear normal. Oropharynx is clear and moist EYES: Conjunctivae and EOM are normal. Pupils are equal, round, and reactive to light. No scleral icterus.  NECK: Normal range of motion, supple, no masses.  Normal thyroid.  SKIN: Skin is warm and dry. No rash noted. Not diaphoretic. No erythema. No pallor. MUSCULOSKELETAL: Normal range of motion. No tenderness.  No cyanosis, clubbing, or edema.  2+ distal pulses. NEUROLOGIC: Alert and oriented to person, place, and time. Normal reflexes, muscle tone coordination. No cranial nerve deficit noted. PSYCHIATRIC: Normal mood and affect. Normal behavior. Normal judgment and thought content. CARDIOVASCULAR: Normal heart rate noted. RESPIRATORY: Effort and breath sounds normal, no problems with respiration noted. BREASTS: Symmetric in size. No masses, skin changes, nipple drainage, or lymphadenopathy.  ABDOMEN: Soft, normal bowel sounds, no distention noted.  No tenderness, rebound or guarding.  PELVIC: Normal appearing external genitalia; normal appearing vaginal mucosa and cervix.  No abnormal discharge noted.  Pap smear obtained.  Normal uterine size, no other palpable masses, no uterine or adnexal tenderness.   Assessment and Plan:      1.  Encounter for other general counseling or advice on contraception 2. Abnormal uterine bleeding (AUB) Discussed other evaluation needed: labs, endometrial biopsy and pelvic ultrasound. She declines these for now due to cost. She will get charity care application and reports she will call for the rest of the evaluation. Pelvic ultrasound ordered, will be scheduled as per patient's request later.  - [place] [facility] Pelvis Complete with Transvaginal; Future Recommended progestin IUD for contraception especially in the setting of her AUB, she is considering this. She will call to get this placed if desired; two weeks of protected intercourse/abstinence prior to placement was recommended.  3. Well woman exam with routine gynecological exam - PAP  Will follow up results of pap smear and manage accordingly. Mammogram application given to patient. Routine preventative health maintenance measures emphasized. Please refer to After Visit Summary for other counseling recommendations.      Verita Schneiders, MD, [place] for [employer], [place]

## 2019-06-26 NOTE — Patient Instructions (Addendum)
Abnormal Uterine Bleeding Abnormal uterine bleeding means bleeding more than usual from your uterus. It can include:  Bleeding between periods.  Bleeding after sex.  Bleeding that is heavier than normal.  Periods that last longer than usual.  Bleeding after you have stopped having your period (menopause). There are many problems that may cause this. You should see a doctor for any kind of bleeding that is not normal. Treatment depends on the cause of the bleeding. Follow these instructions at home:  Watch your condition for any changes.  Do not use tampons, douche, or have sex, if your doctor tells you not to.  Change your pads often.  Get regular well-woman exams. Make sure they include a pelvic exam and cervical cancer screening.  Keep all follow-up visits as told by your doctor. This is important. Contact a doctor if:  The bleeding lasts more than one week.  You feel dizzy at times.  You feel like you are going to throw up (nauseous).  You throw up. Get help right away if:  You pass out.  You have to change pads every hour.  You have belly (abdominal) pain.  You have a fever.  You get sweaty.  You get weak.  You passing large blood clots from your vagina. Summary  Abnormal uterine bleeding means bleeding more than usual from your uterus.  There are many problems that may cause this. You should see a doctor for any kind of bleeding that is not normal.  Treatment depends on the cause of the bleeding. This information is not intended to replace advice given to you by your health care provider. Make sure you discuss any questions you have with your health care provider. Document Released: 06/03/2009 Document Revised: 07/31/2016 Document Reviewed: 07/31/2016 Elsevier Patient Education  2020 [employer].    Intrauterine Device Information An intrauterine device (IUD) is a medical device that is inserted in the uterus to prevent pregnancy. It is a small,  T-shaped device that has one or two nylon strings hanging down from it. The strings hang out of the lower part of the uterus (cervix) to allow for future IUD removal. There are two types of IUDs available: Hormone IUD. This type of IUD is made of plastic and contains the hormone progestin (synthetic progesterone). A hormone IUD may last 3-5 years. Copper IUD. This type of IUD has copper wire wrapped around it. A copper IUD may last up to 10 years. How is an IUD inserted? An IUD is inserted through the vagina and placed into the uterus with a minor medical procedure. The exact procedure for IUD insertion may vary among health care providers and hospitals. How does an IUD work? Synthetic progesterone in a hormonal IUD prevents pregnancy by: Thickening cervical mucus to prevent sperm from entering the uterus. Thinning the uterine lining to prevent a fertilized egg from being implanted there. Copper in a copper IUD prevents pregnancy by making the uterus and fallopian tubes produce a fluid that kills sperm. What are the advantages of an IUD? Advantages of either type of IUD It is highly effective in preventing pregnancy. It is reversible. You can become pregnant shortly after the IUD is removed. It is low-maintenance and can stay in place for a long time. There are no estrogen-related side effects. It can be used when breastfeeding. It is not associated with weight gain. It can be inserted right after childbirth, an abortion, or a miscarriage. Advantages of a hormone IUD If it is inserted within 7  days of your period starting, it works right after it is inserted. If the hormone IUD is inserted at any other time in your cycle, you will need to use a backup method of birth control for 7 days after insertion. It can make menstrual periods lighter. It can reduce menstrual cramping. It can be used for 3-5 years. Advantages of a copper IUD It works right after it is inserted. It can be used as a form  of emergency birth control if it is inserted within 5 days after having unprotected sex. It does not interfere with your body's natural hormones. It can be used for 10 years. What are the disadvantages of an IUD? An IUD may cause irregular menstrual bleeding for a period of time after insertion. You may have pain during insertion and have cramping and vaginal bleeding after insertion. An IUD may cut the uterus (uterine perforation) when it is inserted. This is rare. An IUD may cause pelvic inflammatory disease (PID), which is an infection in the uterus and fallopian tubes. This is rare, and it usually happens during the first 20 days after the IUD is inserted. A copper IUD can make your menstrual flow heavier and more painful. How is an IUD removed? You will lie on your back with your knees bent and your feet in footrests (stirrups). A device will be inserted into your vagina to spread apart the vaginal walls (speculum). This will allow your health care provider to see the strings attached to the IUD. Your health care provider will use a small instrument (forceps) to grasp the IUD strings and pull firmly until the IUD is removed. You may have some discomfort when the IUD is removed. Your health care provider may recommend taking over-the-counter pain relievers, such as ibuprofen, before the procedure. You may also have minor spotting for a few days after the procedure. The exact procedure for IUD removal may vary among health care providers and hospitals. Is the IUD right for me? Your health care provider will make sure you are a good candidate for an IUD and will discuss the advantages, disadvantages, and possible side effects with you. Summary An intrauterine device (IUD) is a medical device that is inserted in the uterus to prevent pregnancy. It is a small, T-shaped device that has one or two nylon strings hanging down from it. A hormone IUD contains the hormone progestin (synthetic  progesterone). A copper IUD has copper wire wrapped around it. Synthetic progesterone in a hormone IUD prevents pregnancy by thickening cervical mucus and thinning the walls of the uterus. Copper in a copper IUD prevents pregnancy by making the uterus and fallopian tubes produce a fluid that kills sperm. A hormone IUD can be left in place for 3-5 years. A copper IUD can be left in place for up to 10 years. An IUD is inserted and removed by a health care provider. You may feel some pain during insertion and removal. Your health care provider may recommend taking over-the-counter pain medicine, such as ibuprofen, before an IUD procedure. This information is not intended to replace advice given to you by your health care provider. Make sure you discuss any questions you have with your health care provider. Document Released: 07/10/2004 Document Revised: 07/19/2017 Document Reviewed: 09/04/2016 Elsevier Patient Education  2020 [employer].

## 2019-07-01 ENCOUNTER — Telehealth: Payer: Self-pay | Admitting: *Deleted

## 2019-07-01 ENCOUNTER — Encounter: Payer: Self-pay | Admitting: Obstetrics & Gynecology

## 2019-07-01 DIAGNOSIS — N939 Abnormal uterine and vaginal bleeding, unspecified: Secondary | ICD-10-CM | POA: Insufficient documentation

## 2019-07-01 LAB — CYTOLOGY - PAP
Chlamydia: NEGATIVE
Comment: NEGATIVE
Comment: NEGATIVE
Comment: NEGATIVE
Comment: NEGATIVE
Comment: NEGATIVE
Comment: NORMAL
Diagnosis: NEGATIVE
Diagnosis: REACTIVE
HPV 16: NEGATIVE
HPV 18 / 45: NEGATIVE
High risk HPV: POSITIVE — AB
Neisseria Gonorrhea: NEGATIVE
Trichomonas: NEGATIVE

## 2019-07-01 NOTE — Telephone Encounter (Signed)
I called Asli and was unable to leave a message- heard a message " your call cannot be completed at this time; please call again later". Linda,RN

## 2019-07-01 NOTE — Telephone Encounter (Signed)
-----   Message from Osborne Oman, MD sent at 07/01/2019  2:43 PM EST ----- Normal cytology on pap smear with positive HRHPV, but negative HPV 16 and 18/45 on 06/26/2019  Will repeat cotesting in one year. Problem list updated. Please call to inform patient of results. Results were also released to MyChart and patient was given recommendations as indicated.

## 2019-07-03 NOTE — Telephone Encounter (Signed)
I called Jeanny and was unable to leave a message - heard a message " your call cannot be completed at this time- please call again later". Will send MyChart message.  Cherolyn Behrle,RN

## 2019-09-25 ENCOUNTER — Other Ambulatory Visit (HOSPITAL_COMMUNITY): Payer: Self-pay

## 2019-09-25 ENCOUNTER — Other Ambulatory Visit: Payer: Self-pay

## 2019-09-25 ENCOUNTER — Ambulatory Visit (INDEPENDENT_AMBULATORY_CARE_PROVIDER_SITE_OTHER): Payer: Self-pay | Admitting: Nurse Practitioner

## 2019-09-25 ENCOUNTER — Encounter: Payer: Self-pay | Admitting: Nurse Practitioner

## 2019-09-25 VITALS — BP 138/83 | HR 95 | Temp 98.5°F | Resp 14 | Ht 66.0 in | Wt 210.0 lb

## 2019-09-25 DIAGNOSIS — I10 Essential (primary) hypertension: Secondary | ICD-10-CM

## 2019-09-25 DIAGNOSIS — D508 Other iron deficiency anemias: Secondary | ICD-10-CM

## 2019-09-25 DIAGNOSIS — Z0000 Encounter for general adult medical examination without abnormal findings: Secondary | ICD-10-CM

## 2019-09-25 LAB — POCT URINALYSIS DIPSTICK
Bilirubin, UA: NEGATIVE
Blood, UA: NEGATIVE
Glucose, UA: NEGATIVE
Ketones, UA: NEGATIVE
Leukocytes, UA: NEGATIVE
Nitrite, UA: NEGATIVE
Protein, UA: POSITIVE — AB
Spec Grav, UA: 1.02 (ref 1.010–1.025)
Urobilinogen, UA: 1 E.U./dL
pH, UA: 5.5 (ref 5.0–8.0)

## 2019-09-25 MED ORDER — IBUPROFEN 800 MG PO TABS: ORAL_TABLET | ORAL | 1 refills | Status: DC

## 2019-09-25 NOTE — Progress Notes (Signed)
Established Patient Office Visit  Subjective:  Patient ID: Susan Mathis, female    DOB: [birth year]  Age: 49 y.o. MRN: [id number]  CC:  Chief Complaint  Patient presents with  . Hypertension  . Heart Murmur  . Medication Refill    ibuprofen 800mg .     HPI Susan Mathis presents for 13-month follow-up.  She is a previous patient of Ms. Mena Pauls, FNP.  She has a history of hypertension, migraine and anemia.  She has recently followed up with GYN for her Well woman visit in November with PAP.  When observing her labs her 04/01/19 hemoglobin 7.4 and iron saturation 3 and ferritin 3.  She admits that she is using some over-the-counter iron.  She uses it every other day because it causes her to have headaches.  She also admits that she has an occasional sharp pain in her chest that comes and goes.  She had related to her diet.  However admits that isn't always related.  The pain only lasts for a little while.  She denies any additional symptoms fevers, chills, nausea, vomiting, shortness of breath, back pain or swelling.   Past Medical History:  Diagnosis Date  . Fibroids   . Hypertension   . Migraines     Past Surgical History:  Procedure Laterality Date  . CESAREAN SECTION      Family History  Problem Relation Age of Onset  . Hypertension Mother   . Hypertension Father     Social History   Socioeconomic History  . Marital status: Single    Spouse name: Not on file  . Number of children: Not on file  . Years of education: Not on file  . Highest education level: Not on file  Occupational History  . Not on file  Tobacco Use  . Smoking status: Never Smoker  . Smokeless tobacco: Never Used  Substance and Sexual Activity  . Alcohol use: Not Currently  . Drug use: Never  . Sexual activity: Not on file  Other Topics Concern  . Not on file  Social History Narrative  . Not on file   Social Determinants of Health   Financial Resource Strain:   . Difficulty of  Paying Living Expenses: Not on file  Food Insecurity:   . Worried About Charity fundraiser in the Last Year: Not on file  . Ran Out of Food in the Last Year: Not on file  Transportation Needs:   . Lack of Transportation (Medical): Not on file  . Lack of Transportation (Non-Medical): Not on file  Physical Activity:   . Days of Exercise per Week: Not on file  . Minutes of Exercise per Session: Not on file  Stress:   . Feeling of Stress : Not on file  Social Connections:   . Frequency of Communication with Friends and Family: Not on file  . Frequency of Social Gatherings with Friends and Family: Not on file  . Attends Religious Services: Not on file  . Active Member of Clubs or Organizations: Not on file  . Attends Archivist Meetings: Not on file  . Marital Status: Not on file  Intimate Partner Violence:   . Fear of Current or Ex-Partner: Not on file  . Emotionally Abused: Not on file  . Physically Abused: Not on file  . Sexually Abused: Not on file    Outpatient Medications Prior to Visit  Medication Sig Dispense Refill  . amLODipine (NORVASC) 10 MG tablet Take 1 tablet (  10 mg total) by mouth daily. 90 tablet 3  . Aspirin-Acetaminophen-Caffeine (GOODY HEADACHE PO) Take by mouth.    . hydrochlorothiazide (HYDRODIURIL) 25 MG tablet Take 1 tablet (25 mg total) by mouth daily. 90 tablet 3  . ibuprofen (ADVIL) 800 MG tablet TAKE 1 TABLET BY MOUTH TWICE DAILY WITH MEALS AS NEEDED FOR PAIN 30 tablet 1  . methimazole (TAPAZOLE) 10 MG tablet Take 1 tablet (10 mg total) by mouth 2 (two) times daily. (Patient not taking: Reported on 06/26/2019) 60 tablet 2  . loratadine (CLARITIN) 10 MG tablet Take 1 tablet (10 mg total) by mouth daily. (Patient not taking: Reported on 04/01/2019) 30 tablet 11   No facility-administered medications prior to visit.    No Known Allergies  ROS Review of Systems  Constitutional: Negative.   HENT: Negative.   Eyes: Negative.   Respiratory:  Negative.   Cardiovascular:       Occasional sharp pains  Gastrointestinal: Negative.   Endocrine: Negative.   Genitourinary: Negative.   Musculoskeletal: Negative.   Skin: Negative.   Allergic/Immunologic: Negative.   Neurological: Negative.   Hematological: Negative.   Psychiatric/Behavioral: Negative.       Objective:   EKG normal sinus rhythm  Physical Exam  Constitutional: She is oriented to person, place, and time. She appears well-developed and well-nourished.  HENT:  Head: Normocephalic.  Cardiovascular: Normal rate, regular rhythm and normal heart sounds.  Pulmonary/Chest: Effort normal and breath sounds normal.  Abdominal: Soft.  Obese hypoactive  Musculoskeletal:        General: Normal range of motion.     Cervical back: Normal range of motion.  Neurological: She is alert and oriented to person, place, and time.  Skin: Skin is warm and dry.  Psychiatric: She has a normal mood and affect. Her behavior is normal. Judgment and thought content normal.    BP 138/83 (BP Location: Left Arm, Patient Position: Sitting, Cuff Size: Large)   Pulse 95   Temp 98.5 F (36.9 C) (Oral)   Resp 14   Ht 5\' 6"  (1.676 m)   Wt 210 lb (95.3 kg)   LMP 08/25/2019   SpO2 100%   BMI 33.89 kg/m  Wt Readings from Last 3 Encounters:  09/25/19 210 lb (95.3 kg)  06/26/19 208 lb (94.3 kg)  04/01/19 211 lb (95.7 kg)     There are no preventive care reminders to display for this patient.  There are no preventive care reminders to display for this patient.  Lab Results  Component Value Date   TSH 2.780 04/01/2019   Lab Results  Component Value Date   WBC 9.7 09/25/2019   HGB 7.0 (LL) 09/25/2019   HCT 26.7 (L) 09/25/2019   MCV 60 (L) 09/25/2019   PLT 403 09/25/2019   Lab Results  Component Value Date   NA 136 09/25/2019   K 3.7 09/25/2019   CO2 20 09/25/2019   GLUCOSE 136 (H) 09/25/2019   BUN 14 09/25/2019   CREATININE 0.70 09/25/2019   BILITOT 0.2 09/25/2019    ALKPHOS 64 09/25/2019   AST 13 09/25/2019   ALT 11 09/25/2019   PROT 7.9 09/25/2019   ALBUMIN 4.5 09/25/2019   CALCIUM 9.5 09/25/2019   Lab Results  Component Value Date   CHOL 155 02/17/2018   Lab Results  Component Value Date   HDL 61 02/17/2018   Lab Results  Component Value Date   LDLCALC 82 02/17/2018   Lab Results  Component Value Date  TRIG 58 02/17/2018   Lab Results  Component Value Date   CHOLHDL 2.5 02/17/2018   Lab Results  Component Value Date   HGBA1C 5.4 02/17/2018      Assessment & Plan:   Problem List Items Addressed This Visit      Unprioritized   Essential hypertension - Primary   Relevant Orders   Urinalysis Dipstick (Completed)   CBC with Differential/Platelet (Completed)   Basic Metabolic Panel (Completed)   Hepatic Function Panel (Completed)    Other Visit Diagnoses    Healthcare maintenance       Relevant Orders   EKG 12-Lead   Other iron deficiency anemia       CBC iron panel pending   Relevant Orders   EKG 12-Lead   Iron (Completed)      Meds ordered this encounter  Medications  . ibuprofen (ADVIL) 800 MG tablet    Sig: TAKE 1 TABLET BY MOUTH TWICE DAILY WITH MEALS AS NEEDED FOR PAIN    Dispense:  30 tablet    Refill:  1    Order Specific Question:   Supervising Provider    Answer:   Tresa Garter [id number]    Follow-up: No follow-ups on file.    [name], NP

## 2019-09-26 ENCOUNTER — Other Ambulatory Visit: Payer: Self-pay | Admitting: Nurse Practitioner

## 2019-09-26 LAB — CBC WITH DIFFERENTIAL/PLATELET
Basophils Absolute: 0.1 10*3/uL (ref 0.0–0.2)
Basos: 1 %
EOS (ABSOLUTE): 0.2 10*3/uL (ref 0.0–0.4)
Eos: 2 %
Hematocrit: 26.7 % — ABNORMAL LOW (ref 34.0–46.6)
Hemoglobin: 7 g/dL — CL (ref 11.1–15.9)
Immature Grans (Abs): 0 10*3/uL (ref 0.0–0.1)
Immature Granulocytes: 0 %
Lymphocytes Absolute: 2.4 10*3/uL (ref 0.7–3.1)
Lymphs: 25 %
MCH: 15.8 pg — ABNORMAL LOW (ref 26.6–33.0)
MCHC: 26.2 g/dL — ABNORMAL LOW (ref 31.5–35.7)
MCV: 60 fL — ABNORMAL LOW (ref 79–97)
Monocytes Absolute: 0.7 10*3/uL (ref 0.1–0.9)
Monocytes: 7 %
Neutrophils Absolute: 6.3 10*3/uL (ref 1.4–7.0)
Neutrophils: 65 %
Platelets: 403 10*3/uL (ref 150–450)
RBC: 4.44 x10E6/uL (ref 3.77–5.28)
RDW: 18.9 % — ABNORMAL HIGH (ref 11.7–15.4)
WBC: 9.7 10*3/uL (ref 3.4–10.8)

## 2019-09-26 LAB — BASIC METABOLIC PANEL
BUN/Creatinine Ratio: 20 (ref 9–23)
BUN: 14 mg/dL (ref 6–24)
CO2: 20 mmol/L (ref 20–29)
Calcium: 9.5 mg/dL (ref 8.7–10.2)
Chloride: 98 mmol/L (ref 96–106)
Creatinine, Ser: 0.7 mg/dL (ref 0.57–1.00)
GFR calc Af Amer: 118 mL/min/{1.73_m2} (ref >59)
GFR calc non Af Amer: 102 mL/min/{1.73_m2} (ref >59)
Glucose: 136 mg/dL — ABNORMAL HIGH (ref 65–99)
Potassium: 3.7 mmol/L (ref 3.5–5.2)
Sodium: 136 mmol/L (ref 134–144)

## 2019-09-26 LAB — HEPATIC FUNCTION PANEL
ALT: 11 IU/L (ref 0–32)
AST: 13 IU/L (ref 0–40)
Albumin: 4.5 g/dL (ref 3.8–4.8)
Alkaline Phosphatase: 64 IU/L (ref 39–117)
Bilirubin Total: 0.2 mg/dL (ref 0.0–1.2)
Bilirubin, Direct: 0.1 mg/dL (ref 0.00–0.40)
Total Protein: 7.9 g/dL (ref 6.0–8.5)

## 2019-09-26 LAB — IRON: Iron: 14 ug/dL — ABNORMAL LOW (ref 27–159)

## 2019-09-26 MED ORDER — FERROUS FUMARATE 324 (106 FE) MG PO TABS: 1.0000 | ORAL_TABLET | Freq: Every day | ORAL | 0 refills | Status: DC

## 2019-09-30 ENCOUNTER — Telehealth: Payer: Self-pay

## 2019-09-30 NOTE — Telephone Encounter (Signed)
Called and spoke with patient. Advised that hemoglobin was low at last lab draw. She states she is feeling fatigue and is having some slight chest pain. I spoke with Dionisio David, NP she advised we need to get pt in for blood transfusion. Spoke with Karena Addison to get scheduled, next available was for Monday 10/04/18@9 :30am. Patient was informed of appointment and advised to go directly to ER if SOB or increased chest pain or extreme fatigue.

## 2019-09-30 NOTE — Telephone Encounter (Signed)
Patient was informed of this and Type and Screen has been scheduled for tomorrow and then blood transfusion on Friday 10/02/19. Thanks!

## 2019-09-30 NOTE — Telephone Encounter (Signed)
-----   Message from [name], NP sent at 09/30/2019  2:43 PM EST ----- Can you please contact Ms. Barnhard and let her know that she needs to come to the hospital and get her blood drawn to have her type and screen for the blood transfusion prior to the infusion.

## 2019-10-01 ENCOUNTER — Other Ambulatory Visit: Payer: Self-pay | Admitting: Nurse Practitioner

## 2019-10-01 ENCOUNTER — Encounter: Payer: Self-pay | Admitting: Nurse Practitioner

## 2019-10-01 ENCOUNTER — Other Ambulatory Visit: Payer: Self-pay

## 2019-10-01 ENCOUNTER — Ambulatory Visit (HOSPITAL_COMMUNITY)
Admission: RE | Admit: 2019-10-01 | Discharge: 2019-10-01 | Disposition: A | Discharge status: Home / Self Care | Payer: Medicaid Other | Source: Ambulatory Visit | Attending: Internal Medicine | Admitting: Internal Medicine

## 2019-10-01 DIAGNOSIS — D649 Anemia, unspecified: Secondary | ICD-10-CM | POA: Insufficient documentation

## 2019-10-01 DIAGNOSIS — D500 Iron deficiency anemia secondary to blood loss (chronic): Secondary | ICD-10-CM | POA: Insufficient documentation

## 2019-10-01 LAB — ABO/RH: ABO/RH(D): B POS

## 2019-10-01 LAB — PREPARE RBC (CROSSMATCH)

## 2019-10-01 MED ORDER — SODIUM CHLORIDE 0.9% IV SOLUTION: Freq: Once | INTRAVENOUS | Status: DC

## 2019-10-01 NOTE — Progress Notes (Signed)
Type and screen drawn and blue band placed on patient's wrist. Patient is to come back tomorrow for transfusion of 2 units PRBCs . Alert, oriented and ambulatory at discharge.

## 2019-10-01 NOTE — Assessment & Plan Note (Signed)
Hemoglobin 7.0 chest pains and shortness of breath 2 units packed red blood cells to be infused

## 2019-10-02 ENCOUNTER — Telehealth: Payer: Self-pay

## 2019-10-02 ENCOUNTER — Ambulatory Visit (HOSPITAL_COMMUNITY)
Admission: RE | Admit: 2019-10-02 | Discharge: 2019-10-02 | Disposition: A | Discharge status: Home / Self Care | Payer: Medicaid Other | Source: Ambulatory Visit | Attending: Internal Medicine | Admitting: Internal Medicine

## 2019-10-02 DIAGNOSIS — D500 Iron deficiency anemia secondary to blood loss (chronic): Secondary | ICD-10-CM

## 2019-10-02 LAB — HEMOGLOBIN AND HEMATOCRIT, BLOOD
HCT: 29.4 % — ABNORMAL LOW (ref 36.0–46.0)
Hemoglobin: 8.1 g/dL — ABNORMAL LOW (ref 12.0–15.0)

## 2019-10-02 LAB — PREPARE RBC (CROSSMATCH)

## 2019-10-02 MED ORDER — SODIUM CHLORIDE 0.9% IV SOLUTION: Freq: Once | INTRAVENOUS | Status: AC

## 2019-10-02 NOTE — Telephone Encounter (Signed)
-----   Message from [name], NP sent at 10/02/2019  1:44 PM EST ----- Ms. Susan Mathis.This is your posttransfusion hemoglobin and hematocrit it has improved . Continue to use iron therapy.  Follow-up with GYN for additional treatment for abnormal bleeding.Have a good day

## 2019-10-02 NOTE — Discharge Instructions (Signed)

## 2019-10-02 NOTE — Progress Notes (Signed)
PATIENT CARE CENTER NOTE  Diagnosis: Symptomatic Anemia    Provider: Dionisio David, NP    Procedure: 1 unit PRBC   Note: Patient received 1 unit of blood via PIV. Tolerated well with no adverse reaction. Vital signs stable. Post-transfusion H&H drawn. Discharge instructions given. Patient alert, oriented and ambulatory at discharge.

## 2019-10-02 NOTE — Telephone Encounter (Signed)
Called and spoke with patient, advised that posttransfusion hemoglobin and hematocrit has improved. Asked that she continue iron therapy and seek gyn for additional treatment of abnormal bleeding. Patient verbalized understanding. Thanks!

## 2019-10-05 ENCOUNTER — Encounter (HOSPITAL_COMMUNITY): Payer: Medicaid Other

## 2019-10-05 LAB — BPAM RBC
Blood Product Expiration Date: 202103172359
Blood Product Expiration Date: 202103172359
ISSUE DATE / TIME: 202102111058
ISSUE DATE / TIME: 202102120842
Unit Type and Rh: 7300
Unit Type and Rh: 7300

## 2019-10-05 LAB — TYPE AND SCREEN
ABO/RH(D): B POS
Antibody Screen: NEGATIVE
Unit division: 0
Unit division: 0

## 2019-12-10 ENCOUNTER — Other Ambulatory Visit: Payer: Self-pay

## 2019-12-11 MED ORDER — FERROUS FUMARATE 324 (106 FE) MG PO TABS: 1.0000 | ORAL_TABLET | Freq: Every day | ORAL | 0 refills | Status: DC

## 2020-03-02 ENCOUNTER — Other Ambulatory Visit: Payer: Self-pay | Admitting: Nurse Practitioner

## 2020-03-02 ENCOUNTER — Telehealth: Payer: Self-pay | Admitting: Family Medicine

## 2020-03-02 DIAGNOSIS — I10 Essential (primary) hypertension: Secondary | ICD-10-CM

## 2020-03-02 MED ORDER — AMLODIPINE BESYLATE 10 MG PO TABS: 10.0000 mg | ORAL_TABLET | Freq: Every day | ORAL | 3 refills | Status: DC

## 2020-03-02 MED ORDER — HYDROCHLOROTHIAZIDE 25 MG PO TABS: 25.0000 mg | ORAL_TABLET | Freq: Every day | ORAL | 3 refills | Status: DC

## 2020-03-02 NOTE — Telephone Encounter (Signed)
Medication refills have been sent to the pharmacy.

## 2020-03-08 ENCOUNTER — Other Ambulatory Visit: Payer: Self-pay

## 2020-03-08 MED ORDER — FERROUS FUMARATE 324 (106 FE) MG PO TABS: 1.0000 | ORAL_TABLET | Freq: Every day | ORAL | 0 refills | Status: AC

## 2020-03-12 ENCOUNTER — Other Ambulatory Visit: Payer: Self-pay

## 2020-03-12 ENCOUNTER — Encounter (HOSPITAL_COMMUNITY): Payer: Self-pay

## 2020-03-12 ENCOUNTER — Emergency Department (HOSPITAL_COMMUNITY)
Admission: EM | Admit: 2020-03-12 | Discharge: 2020-03-12 | Disposition: A | Discharge status: Home / Self Care | Payer: Medicaid Other | Attending: Emergency Medicine | Admitting: Emergency Medicine

## 2020-03-12 DIAGNOSIS — R112 Nausea with vomiting, unspecified: Secondary | ICD-10-CM | POA: Insufficient documentation

## 2020-03-12 DIAGNOSIS — R05 Cough: Secondary | ICD-10-CM | POA: Insufficient documentation

## 2020-03-12 DIAGNOSIS — Z5321 Procedure and treatment not carried out due to patient leaving prior to being seen by health care provider: Secondary | ICD-10-CM | POA: Insufficient documentation

## 2020-03-12 LAB — COMPREHENSIVE METABOLIC PANEL
ALT: 18 U/L (ref 0–44)
AST: 17 U/L (ref 15–41)
Albumin: 4 g/dL (ref 3.5–5.0)
Alkaline Phosphatase: 44 U/L (ref 38–126)
Anion gap: 12 (ref 5–15)
BUN: 16 mg/dL (ref 6–20)
CO2: 26 mmol/L (ref 22–32)
Calcium: 9.5 mg/dL (ref 8.9–10.3)
Chloride: 101 mmol/L (ref 98–111)
Creatinine, Ser: 0.52 mg/dL (ref 0.44–1.00)
GFR calc Af Amer: >60 mL/min (ref >60)
GFR calc non Af Amer: >60 mL/min (ref >60)
Glucose, Bld: 141 mg/dL — ABNORMAL HIGH (ref 70–99)
Potassium: 3.6 mmol/L (ref 3.5–5.1)
Sodium: 139 mmol/L (ref 135–145)
Total Bilirubin: 0.5 mg/dL (ref 0.3–1.2)
Total Protein: 7.6 g/dL (ref 6.5–8.1)

## 2020-03-12 LAB — URINALYSIS, ROUTINE W REFLEX MICROSCOPIC
Bilirubin Urine: NEGATIVE
Glucose, UA: NEGATIVE mg/dL
Hgb urine dipstick: NEGATIVE
Ketones, ur: NEGATIVE mg/dL
Leukocytes,Ua: NEGATIVE
Nitrite: NEGATIVE
Protein, ur: NEGATIVE mg/dL
Specific Gravity, Urine: 1.02 (ref 1.005–1.030)
pH: 6 (ref 5.0–8.0)

## 2020-03-12 LAB — CBC
HCT: 40.6 % (ref 36.0–46.0)
Hemoglobin: 12.9 g/dL (ref 12.0–15.0)
MCH: 27 pg (ref 26.0–34.0)
MCHC: 31.8 g/dL (ref 30.0–36.0)
MCV: 85.1 fL (ref 80.0–100.0)
Platelets: 256 10*3/uL (ref 150–400)
RBC: 4.77 MIL/uL (ref 3.87–5.11)
RDW: 13.1 % (ref 11.5–15.5)
WBC: 6.1 10*3/uL (ref 4.0–10.5)
nRBC: 0 % (ref 0.0–0.2)

## 2020-03-12 LAB — I-STAT BETA HCG BLOOD, ED (MC, WL, AP ONLY): I-stat hCG, quantitative: <5 m[IU]/mL (ref <5)

## 2020-03-12 LAB — LIPASE, BLOOD: Lipase: 31 U/L (ref 11–51)

## 2020-03-12 MED ORDER — SODIUM CHLORIDE 0.9% FLUSH: 3.0000 mL | Freq: Once | INTRAVENOUS | Status: DC

## 2020-03-12 NOTE — ED Notes (Signed)
No answer from pt in waiting room 

## 2020-03-12 NOTE — ED Triage Notes (Signed)
Pt arrives to ED w/ c/o n/v and cough that started Monday. Pt denies chest pain, sob, fever, diarrhea.

## 2020-03-24 ENCOUNTER — Other Ambulatory Visit: Payer: Self-pay

## 2020-03-24 ENCOUNTER — Encounter: Payer: Self-pay | Admitting: Nurse Practitioner

## 2020-03-24 ENCOUNTER — Ambulatory Visit (INDEPENDENT_AMBULATORY_CARE_PROVIDER_SITE_OTHER): Payer: Self-pay | Admitting: Nurse Practitioner

## 2020-03-24 VITALS — BP 135/82 | HR 91 | Temp 98.0°F | Resp 14 | Ht 66.0 in | Wt 215.0 lb

## 2020-03-24 DIAGNOSIS — I10 Essential (primary) hypertension: Secondary | ICD-10-CM

## 2020-03-24 DIAGNOSIS — E0590 Thyrotoxicosis, unspecified without thyrotoxic crisis or storm: Secondary | ICD-10-CM

## 2020-03-24 DIAGNOSIS — Z0000 Encounter for general adult medical examination without abnormal findings: Secondary | ICD-10-CM

## 2020-03-24 DIAGNOSIS — D500 Iron deficiency anemia secondary to blood loss (chronic): Secondary | ICD-10-CM

## 2020-03-24 LAB — POCT URINALYSIS DIPSTICK
Bilirubin, UA: NEGATIVE
Blood, UA: NEGATIVE
Glucose, UA: NEGATIVE
Ketones, UA: NEGATIVE
Leukocytes, UA: NEGATIVE
Nitrite, UA: NEGATIVE
Protein, UA: POSITIVE — AB
Spec Grav, UA: 1.02 (ref 1.010–1.025)
Urobilinogen, UA: 2 E.U./dL — AB
pH, UA: 6 (ref 5.0–8.0)

## 2020-03-24 NOTE — Patient Instructions (Signed)
Menopause Menopause is the normal time of life when menstrual periods stop completely. It is usually confirmed by 12 months without a menstrual period. The transition to menopause (perimenopause) most often happens between the ages of 45 and 55. During perimenopause, hormone levels change in your body, which can cause symptoms and affect your health. Menopause may increase your risk for:  Loss of bone (osteoporosis), which causes bone breaks (fractures).  Depression.  Hardening and narrowing of the arteries (atherosclerosis), which can cause heart attacks and strokes. What are the causes? This condition is usually caused by a natural change in hormone levels that happens as you get older. The condition may also be caused by surgery to remove both ovaries (bilateral oophorectomy). What increases the risk? This condition is more likely to start at an earlier age if you have certain medical conditions or treatments, including:  A tumor of the pituitary gland in the brain.  A disease that affects the ovaries and hormone production.  Radiation treatment for cancer.  Certain cancer treatments, such as chemotherapy or hormone (anti-estrogen) therapy.  Heavy smoking and excessive alcohol use.  Family history of early menopause. This condition is also more likely to develop earlier in women who are very thin. What are the signs or symptoms? Symptoms of this condition include:  Hot flashes.  Irregular menstrual periods.  Night sweats.  Changes in feelings about sex. This could be a decrease in sex drive or an increased comfort around your sexuality.  Vaginal dryness and thinning of the vaginal walls. This may cause painful intercourse.  Dryness of the skin and development of wrinkles.  Headaches.  Problems sleeping (insomnia).  Mood swings or irritability.  Memory problems.  Weight gain.  Hair growth on the face and chest.  Bladder infections or problems with urinating. How  is this diagnosed? This condition is diagnosed based on your medical history, a physical exam, your age, your menstrual history, and your symptoms. Hormone tests may also be done. How is this treated? In some cases, no treatment is needed. You and your health care provider should make a decision together about whether treatment is necessary. Treatment will be based on your individual condition and preferences. Treatment for this condition focuses on managing symptoms. Treatment may include:  Menopausal hormone therapy (MHT).  Medicines to treat specific symptoms or complications.  Acupuncture.  Vitamin or herbal supplements. Before starting treatment, make sure to let your health care provider know if you have a personal or family history of:  Heart disease.  Breast cancer.  Blood clots.  Diabetes.  Osteoporosis. Follow these instructions at home: Lifestyle  Do not use any products that contain nicotine or tobacco, such as cigarettes and e-cigarettes. If you need help quitting, ask your health care provider.  Get at least 30 minutes of physical activity on 5 or more days each week.  Avoid alcoholic and caffeinated beverages, as well as spicy foods. This may help prevent hot flashes.  Get 7-8 hours of sleep each night.  If you have hot flashes, try: ? Dressing in layers. ? Avoiding things that may trigger hot flashes, such as spicy food, warm places, or stress. ? Taking slow, deep breaths when a hot flash starts. ? Keeping a fan in your home and office.  Find ways to manage stress, such as deep breathing, meditation, or journaling.  Consider going to group therapy with other women who are having menopause symptoms. Ask your health care provider about recommended group therapy meetings. Eating and   drinking  Eat a healthy, balanced diet that contains whole grains, lean protein, low-fat dairy, and plenty of fruits and vegetables.  Your health care provider may recommend  adding more soy to your diet. Foods that contain soy include tofu, tempeh, and soy milk.  Eat plenty of foods that contain calcium and vitamin D for bone health. Items that are rich in calcium include low-fat milk, yogurt, beans, almonds, sardines, broccoli, and kale. Medicines  Take over-the-counter and prescription medicines only as told by your health care provider.  Talk with your health care provider before starting any herbal supplements. If prescribed, take vitamins and supplements as told by your health care provider. These may include: ? Calcium. Women age 51 and older should get 1,200 mg (milligrams) of calcium every day. ? Vitamin D. Women need 600-800 International Units of vitamin D each day. ? Vitamins B12 and B6. Aim for 50 micrograms of B12 and 1.5 mg of B6 each day. General instructions  Keep track of your menstrual periods, including: ? When they occur. ? How heavy they are and how long they last. ? How much time passes between periods.  Keep track of your symptoms, noting when they start, how often you have them, and how long they last.  Use vaginal lubricants or moisturizers to help with vaginal dryness and improve comfort during sex.  Keep all follow-up visits as told by your health care provider. This is important. This includes any group therapy or counseling. Contact a health care provider if:  You are still having menstrual periods after age 55.  You have pain during sex.  You have not had a period for 12 months and you develop vaginal bleeding. Get help right away if:  You have: ? Severe depression. ? Excessive vaginal bleeding. ? Pain when you urinate. ? A fast or irregular heart beat (palpitations). ? Severe headaches. ? Abdomen (abdominal) pain or severe indigestion.  You fell and you think you have a broken bone.  You develop leg or chest pain.  You develop vision problems.  You feel a lump in your breast. Summary  Menopause is the normal  time of life when menstrual periods stop completely. It is usually confirmed by 12 months without a menstrual period.  The transition to menopause (perimenopause) most often happens between the ages of 45 and 55.  Symptoms can be managed through medicines, lifestyle changes, and complementary therapies such as acupuncture.  Eat a balanced diet that is rich in nutrients to promote bone health and heart health and to manage symptoms during menopause. This information is not intended to replace advice given to you by your health care provider. Make sure you discuss any questions you have with your health care provider. Document Revised: 07/19/2017 Document Reviewed: 09/08/2016 Elsevier Patient Education  2020 Elsevier Inc.  

## 2020-03-24 NOTE — Progress Notes (Signed)
[facility] [place], [place]  [address] Phone:  [phone]   Fax:  [phone]    Established Patient Office Visit  Subjective:  Patient ID: Susan Mathis, female    DOB: June 02, 1971  Age: 49 y.o. MRN: [id number]  CC:  Chief Complaint  Patient presents with  . Hypertension  . Anemia    trouble with iron tablets at pharamcy.     HPI Susan Mathis presents for follow up. She  has a past medical history of Fibroids, Hypertension, and Migraines.   Hypertension Patient is here for follow-up of elevated blood pressure. She is exercising and is adherent to a low-salt diet. Blood pressure is well controlled at home. Cardiac symptoms: none. Patient denies chest pain, dyspnea, irregular heart beat, lower extremity edema, palpitations and syncope. Cardiovascular risk factors: obesity (BMI >= 30 kg/m2). Use of agents associated with hypertension: NSAIDS. History of target organ damage: none. Anemia Patient presents for evaluation of anemia. Anemia was found by routine CBC. It has been present for several months. Associated signs & symptoms: none. She has not had a full cycle since  May. She was wearing a panty liner. She admits that she has fibroids. She did not start menses until age 52.    Past Medical History:  Diagnosis Date  . Fibroids   . Hypertension   . Migraines     Past Surgical History:  Procedure Laterality Date  . CESAREAN SECTION      Family History  Problem Relation Age of Onset  . Hypertension Mother   . Hypertension Father     Social History   Socioeconomic History  . Marital status: Single    Spouse name: Not on file  . Number of children: Not on file  . Years of education: Not on file  . Highest education level: Not on file  Occupational History  . Not on file  Tobacco Use  . Smoking status: Never Smoker  . Smokeless tobacco: Never Used  Vaping Use  . Vaping Use: Never used  Substance and Sexual  Activity  . Alcohol use: Not Currently  . Drug use: Never  . Sexual activity: Not on file  Other Topics Concern  . Not on file  Social History Narrative  . Not on file   Social Determinants of Health   Financial Resource Strain:   . Difficulty of Paying Living Expenses:   Food Insecurity:   . Worried About Charity fundraiser in the Last Year:   . Arboriculturist in the Last Year:   Transportation Needs:   . Film/video editor (Medical):   Marland Kitchen Lack of Transportation (Non-Medical):   Physical Activity:   . Days of Exercise per Week:   . Minutes of Exercise per Session:   Stress:   . Feeling of Stress :   Social Connections:   . Frequency of Communication with Friends and Family:   . Frequency of Social Gatherings with Friends and Family:   . Attends Religious Services:   . Active Member of Clubs or Organizations:   . Attends Archivist Meetings:   Marland Kitchen Marital Status:   Intimate Partner Violence:   . Fear of Current or Ex-Partner:   . Emotionally Abused:   Marland Kitchen Physically Abused:   . Sexually Abused:     Outpatient Medications Prior to Visit  Medication Sig Dispense Refill  . amLODipine (NORVASC) 10 MG tablet Take 1 tablet (10 mg total) by  mouth daily. 90 tablet 3  . Aspirin-Acetaminophen-Caffeine (GOODY HEADACHE PO) Take by mouth.    . hydrochlorothiazide (HYDRODIURIL) 25 MG tablet Take 1 tablet (25 mg total) by mouth daily. 90 tablet 3  . ibuprofen (ADVIL) 800 MG tablet TAKE 1 TABLET BY MOUTH TWICE DAILY WITH MEALS AS NEEDED FOR PAIN 30 tablet 1  . Ferrous Fumarate (HEMOCYTE - 106 MG FE) 324 (106 Fe) MG TABS tablet Take 1 tablet (106 mg of iron total) by mouth daily. (Patient not taking: Reported on 03/24/2020) 90 tablet 0  . methimazole (TAPAZOLE) 10 MG tablet Take 1 tablet (10 mg total) by mouth 2 (two) times daily. (Patient not taking: Reported on 06/26/2019) 60 tablet 2   No facility-administered medications prior to visit.    No Known  Allergies  ROS Review of Systems  All other systems reviewed and are negative.     Objective:    Physical Exam Constitutional:      Appearance: She is obese.  HENT:     Head: Normocephalic and atraumatic.     Nose: Nose normal.     Mouth/Throat:     Mouth: Mucous membranes are moist.  Cardiovascular:     Rate and Rhythm: Normal rate and regular rhythm.     Pulses: Normal pulses.     Heart sounds: Normal heart sounds.  Pulmonary:     Effort: Pulmonary effort is normal.     Breath sounds: Normal breath sounds.  Abdominal:     General: Bowel sounds are normal.     Palpations: Abdomen is soft.  Musculoskeletal:        General: Normal range of motion.  Skin:    General: Skin is warm and dry.     Capillary Refill: Capillary refill takes less than 2 seconds.  Neurological:     General: No focal deficit present.     Mental Status: She is alert and oriented to person, place, and time.  Psychiatric:        Mood and Affect: Mood normal.        Behavior: Behavior normal.        Thought Content: Thought content normal.        Judgment: Judgment normal.     BP 135/82 (BP Location: Left Arm, Patient Position: Sitting, Cuff Size: Large)   Pulse 91   Temp 98 F (36.7 C) (Oral)   Resp 14   Ht 5\' 6"  (1.676 m)   Wt 215 lb (97.5 kg)   SpO2 98%   BMI 34.70 kg/m  Wt Readings from Last 3 Encounters:  03/24/20 215 lb (97.5 kg)  03/12/20 189 lb (85.7 kg)  09/25/19 210 lb (95.3 kg)     Health Maintenance Due  Topic Date Due  . MAMMOGRAM  Never done  . COLONOSCOPY  Never done  . INFLUENZA VACCINE  03/20/2020    There are no preventive care reminders to display for this patient.  Lab Results  Component Value Date   TSH 1.600 03/24/2020   Lab Results  Component Value Date   WBC 7.4 03/24/2020   HGB 13.8 03/24/2020   HCT 42.8 03/24/2020   MCV 84 03/24/2020   PLT 286 03/24/2020   Lab Results  Component Value Date   NA 136 03/24/2020   K 3.7 03/24/2020   CO2 26  03/12/2020   GLUCOSE 166 (H) 03/24/2020   BUN 15 03/24/2020   CREATININE 0.59 03/24/2020   BILITOT 0.4 03/24/2020   ALKPHOS 61 03/24/2020  AST 16 03/24/2020   ALT 18 03/12/2020   PROT 7.9 03/24/2020   ALBUMIN 4.8 03/24/2020   CALCIUM 10.3 (H) 03/24/2020   ANIONGAP 12 03/12/2020   Lab Results  Component Value Date   CHOL 155 02/17/2018   Lab Results  Component Value Date   HDL 61 02/17/2018   Lab Results  Component Value Date   LDLCALC 82 02/17/2018   Lab Results  Component Value Date   TRIG 58 02/17/2018   Lab Results  Component Value Date   CHOLHDL 2.5 02/17/2018   Lab Results  Component Value Date   HGBA1C 5.4 02/17/2018      Assessment & Plan:   Problem List Items Addressed This Visit      Cardiovascular and Mediastinum   Essential hypertension - Primary Encouraged on going compliance with current medication regimen Encouraged home monitoring and recording BP <130/80 Eating a heart-healthy diet with less salt Encouraged regular physical activity  Recommend Weight loss      Relevant Orders   Urinalysis Dipstick (Completed)   Comp. Metabolic Panel (12) (Completed)     Other   Anemia Continue with current regimen   Relevant Orders   CBC with Differential/Platelet (Completed)   Iron, TIBC and Ferritin Panel (Completed)    Other Visit Diagnoses    Hyperthyroidism     Reevaluation    Relevant Orders   TSH (Completed)   T4, free (Completed)   T3 (Completed)   Healthcare maintenance       Relevant Orders   Hepatitis C antibody (Completed)      No orders of the defined types were placed in this encounter.   Follow-up: Return in about 3 months (around 06/24/2020).    [name], NP

## 2020-03-25 LAB — COMP. METABOLIC PANEL (12)
AST: 16 IU/L (ref 0–40)
Albumin/Globulin Ratio: 1.5 (ref 1.2–2.2)
Albumin: 4.8 g/dL (ref 3.8–4.8)
Alkaline Phosphatase: 61 IU/L (ref 48–121)
BUN/Creatinine Ratio: 25 — ABNORMAL HIGH (ref 9–23)
BUN: 15 mg/dL (ref 6–24)
Bilirubin Total: 0.4 mg/dL (ref 0.0–1.2)
Calcium: 10.3 mg/dL — ABNORMAL HIGH (ref 8.7–10.2)
Chloride: 96 mmol/L (ref 96–106)
Creatinine, Ser: 0.59 mg/dL (ref 0.57–1.00)
GFR calc Af Amer: 124 mL/min/{1.73_m2} (ref >59)
GFR calc non Af Amer: 107 mL/min/{1.73_m2} (ref >59)
Globulin, Total: 3.1 g/dL (ref 1.5–4.5)
Glucose: 166 mg/dL — ABNORMAL HIGH (ref 65–99)
Potassium: 3.7 mmol/L (ref 3.5–5.2)
Sodium: 136 mmol/L (ref 134–144)
Total Protein: 7.9 g/dL (ref 6.0–8.5)

## 2020-03-25 LAB — IRON,TIBC AND FERRITIN PANEL
Ferritin: 73 ng/mL (ref 15–150)
Iron Saturation: 18 % (ref 15–55)
Iron: 65 ug/dL (ref 27–159)
Total Iron Binding Capacity: 353 ug/dL (ref 250–450)
UIBC: 288 ug/dL (ref 131–425)

## 2020-03-25 LAB — CBC WITH DIFFERENTIAL/PLATELET
Basophils Absolute: 0 10*3/uL (ref 0.0–0.2)
Basos: 1 %
EOS (ABSOLUTE): 0.2 10*3/uL (ref 0.0–0.4)
Eos: 3 %
Hematocrit: 42.8 % (ref 34.0–46.6)
Hemoglobin: 13.8 g/dL (ref 11.1–15.9)
Immature Grans (Abs): 0 10*3/uL (ref 0.0–0.1)
Immature Granulocytes: 0 %
Lymphocytes Absolute: 2.3 10*3/uL (ref 0.7–3.1)
Lymphs: 31 %
MCH: 27.1 pg (ref 26.6–33.0)
MCHC: 32.2 g/dL (ref 31.5–35.7)
MCV: 84 fL (ref 79–97)
Monocytes Absolute: 0.7 10*3/uL (ref 0.1–0.9)
Monocytes: 10 %
Neutrophils Absolute: 4.1 10*3/uL (ref 1.4–7.0)
Neutrophils: 55 %
Platelets: 286 10*3/uL (ref 150–450)
RBC: 5.1 x10E6/uL (ref 3.77–5.28)
RDW: 13.8 % (ref 11.7–15.4)
WBC: 7.4 10*3/uL (ref 3.4–10.8)

## 2020-03-25 LAB — TSH: TSH: 1.6 u[IU]/mL (ref 0.450–4.500)

## 2020-03-25 LAB — T3: T3, Total: 143 ng/dL (ref 71–180)

## 2020-03-25 LAB — T4, FREE: Free T4: 1.51 ng/dL (ref 0.82–1.77)

## 2020-03-25 LAB — HEPATITIS C ANTIBODY: Hep C Virus Ab: <0.1 s/co ratio (ref 0.0–0.9)

## 2020-05-02 ENCOUNTER — Telehealth: Payer: Self-pay | Admitting: Nurse Practitioner

## 2020-05-02 ENCOUNTER — Other Ambulatory Visit: Payer: Self-pay | Admitting: Nurse Practitioner

## 2020-05-02 DIAGNOSIS — I10 Essential (primary) hypertension: Secondary | ICD-10-CM

## 2020-05-02 NOTE — Telephone Encounter (Signed)
Correction I am not because there is a years supply in the computer. Please have the patient to put in a refill request via the pharmacy policy

## 2020-05-02 NOTE — Telephone Encounter (Signed)
She has refills but I will send in a new Rx

## 2020-05-03 NOTE — Telephone Encounter (Signed)
Called patient to inform of prescription at pharmacy. She was already aware

## 2020-06-24 ENCOUNTER — Encounter: Payer: Self-pay | Admitting: Nurse Practitioner

## 2020-06-24 ENCOUNTER — Other Ambulatory Visit: Payer: Self-pay

## 2020-06-24 ENCOUNTER — Ambulatory Visit (INDEPENDENT_AMBULATORY_CARE_PROVIDER_SITE_OTHER): Payer: Self-pay | Admitting: Nurse Practitioner

## 2020-06-24 VITALS — BP 137/90 | HR 97 | Temp 97.9°F | Resp 17 | Ht 65.0 in | Wt 224.2 lb

## 2020-06-24 DIAGNOSIS — Z1212 Encounter for screening for malignant neoplasm of rectum: Secondary | ICD-10-CM

## 2020-06-24 DIAGNOSIS — E669 Obesity, unspecified: Secondary | ICD-10-CM

## 2020-06-24 DIAGNOSIS — M25521 Pain in right elbow: Secondary | ICD-10-CM

## 2020-06-24 DIAGNOSIS — I10 Essential (primary) hypertension: Secondary | ICD-10-CM

## 2020-06-24 DIAGNOSIS — Z131 Encounter for screening for diabetes mellitus: Secondary | ICD-10-CM

## 2020-06-24 DIAGNOSIS — D500 Iron deficiency anemia secondary to blood loss (chronic): Secondary | ICD-10-CM

## 2020-06-24 DIAGNOSIS — Z1231 Encounter for screening mammogram for malignant neoplasm of breast: Secondary | ICD-10-CM

## 2020-06-24 DIAGNOSIS — E66812 Obesity, class 2: Secondary | ICD-10-CM

## 2020-06-24 DIAGNOSIS — Z1211 Encounter for screening for malignant neoplasm of colon: Secondary | ICD-10-CM

## 2020-06-24 NOTE — Patient Instructions (Addendum)
Elbow Bursitis Bursitis is swelling and pain at the tip of your elbow. This happens when fluid builds up in a sac under your skin (bursa). This may also be called olecranon bursitis. Follow these instructions at home: Medicines  Take over-the-counter and prescription medicines only as told by your doctor.  If you were prescribed an antibiotic, take it exactly as told by your doctor. Do not stop taking it even if you start to feel better. Managing pain, stiffness, and swelling   If told, put ice on your elbow: ? Put ice in a plastic bag. ? Place a towel between your skin and the bag. ? Leave the ice on for 20 minutes, 2-3 times a day.  If your bursitis is caused by an injury, follow instructions from your doctor about: ? Resting your elbow. ? Wearing a bandage.  Wear elbow pads or elbow wraps as needed. These help cushion your elbow. General instructions  Avoid any activities that cause elbow pain. Ask your doctor what activities are safe for you.  Keep all follow-up visits as told by your doctor. This is important. Contact a doctor if you have:  A fever.  Problems that do not get better with treatment.  Pain or swelling that: ? Gets worse. ? Goes away and then comes back.  Pus draining from your elbow. Get help right away if you have:  Trouble moving your arm, hand, or fingers. Summary  Bursitis is swelling and pain at the tip of the elbow.  You may need to take medicine or put ice on your elbow.  Contact your doctor if your problems do not get better with treatment. This information is not intended to replace advice given to you by your health care provider. Make sure you discuss any questions you have with your health care provider. Document Revised: 07/19/2017 Document Reviewed: 07/16/2017 Elsevier Patient Education  2020 [place] Maintenance, Female Adopting a healthy lifestyle and getting preventive care are important in promoting health and  wellness. Ask your health care provider about:  The right schedule for you to have regular tests and exams.  Things you can do on your own to prevent diseases and keep yourself healthy. What should I know about diet, weight, and exercise? Eat a healthy diet   Eat a diet that includes plenty of vegetables, fruits, low-fat dairy products, and lean protein.  Do not eat a lot of foods that are high in solid fats, added sugars, or sodium. Maintain a healthy weight Body mass index (BMI) is used to identify weight problems. It estimates body fat based on height and weight. Your health care provider can help determine your BMI and help you achieve or maintain a healthy weight. Get regular exercise Get regular exercise. This is one of the most important things you can do for your health. Most adults should:  Exercise for at least 150 minutes each week. The exercise should increase your heart rate and make you sweat (moderate-intensity exercise).  Do strengthening exercises at least twice a week. This is in addition to the moderate-intensity exercise.  Spend less time sitting. Even light physical activity can be beneficial. Watch cholesterol and blood lipids Have your blood tested for lipids and cholesterol at 50 years of age, then have this test every 5 years. Have your cholesterol levels checked more often if:  Your lipid or cholesterol levels are high.  You are older than 50 years of age.  You are at high risk for heart  disease. What should I know about cancer screening? Depending on your health history and family history, you may need to have cancer screening at various ages. This may include screening for:  Breast cancer.  Cervical cancer.  Colorectal cancer.  Skin cancer.  Lung cancer. What should I know about heart disease, diabetes, and high blood pressure? Blood pressure and heart disease  High blood pressure causes heart disease and increases the risk of stroke. This is  more likely to develop in people who have high blood pressure readings, are of African descent, or are overweight.  Have your blood pressure checked: ? Every 3-5 years if you are 9-74 years of age. ? Every year if you are 33 years old or older. Diabetes Have regular diabetes screenings. This checks your fasting blood sugar level. Have the screening done:  Once every three years after age 55 if you are at a normal weight and have a low risk for diabetes.  More often and at a younger age if you are overweight or have a high risk for diabetes. What should I know about preventing infection? Hepatitis B If you have a higher risk for hepatitis B, you should be screened for this virus. Talk with your health care provider to find out if you are at risk for hepatitis B infection. Hepatitis C Testing is recommended for:  Everyone born from [birth year] through 1966.  Anyone with known risk factors for hepatitis C. Sexually transmitted infections (STIs)  Get screened for STIs, including gonorrhea and chlamydia, if: ? You are sexually active and are younger than 50 years of age. ? You are older than 50 years of age and your health care provider tells you that you are at risk for this type of infection. ? Your sexual activity has changed since you were last screened, and you are at increased risk for chlamydia or gonorrhea. Ask your health care provider if you are at risk.  Ask your health care provider about whether you are at high risk for HIV. Your health care provider may recommend a prescription medicine to help prevent HIV infection. If you choose to take medicine to prevent HIV, you should first get tested for HIV. You should then be tested every 3 months for as long as you are taking the medicine. Pregnancy  If you are about to stop having your period (premenopausal) and you may become pregnant, seek counseling before you get pregnant.  Take 400 to 800 micrograms (mcg) of folic acid every day if  you become pregnant.  Ask for birth control (contraception) if you want to prevent pregnancy. Osteoporosis and menopause Osteoporosis is a disease in which the bones lose minerals and strength with aging. This can result in bone fractures. If you are 46 years old or older, or if you are at risk for osteoporosis and fractures, ask your health care provider if you should:  Be screened for bone loss.  Take a calcium or vitamin D supplement to lower your risk of fractures.  Be given hormone replacement therapy (HRT) to treat symptoms of menopause. Follow these instructions at home: Lifestyle  Do not use any products that contain nicotine or tobacco, such as cigarettes, e-cigarettes, and chewing tobacco. If you need help quitting, ask your health care provider.  Do not use street drugs.  Do not share needles.  Ask your health care provider for help if you need support or information about quitting drugs. Alcohol use  Do not drink alcohol if: ? Your health  care provider tells you not to drink. ? You are pregnant, may be pregnant, or are planning to become pregnant.  If you drink alcohol: ? Limit how much you use to 0-1 drink a day. ? Limit intake if you are breastfeeding.  Be aware of how much alcohol is in your drink. In the [place] one drink equals one 12 oz bottle of beer (355 mL), one 5 oz glass of wine (148 mL), or one 1 oz glass of hard liquor (44 mL). General instructions  Schedule regular health, dental, and eye exams.  Stay current with your vaccines.  Tell your health care provider if: ? You often feel depressed. ? You have ever been abused or do not feel safe at home. Summary  Adopting a healthy lifestyle and getting preventive care are important in promoting health and wellness.  Follow your health care provider's instructions about healthy diet, exercising, and getting tested or screened for diseases.  Follow your health care provider's instructions on monitoring  your cholesterol and blood pressure. This information is not intended to replace advice given to you by your health care provider. Make sure you discuss any questions you have with your health care provider. Document Revised: 07/30/2018 Document Reviewed: 07/30/2018 Elsevier Patient Education  2020 [employer].

## 2020-06-24 NOTE — Progress Notes (Signed)
[facility] [place], [place]  [address] Phone:  [phone]   Fax:  [phone]   Established Patient Office Visit  Subjective:  Patient ID: Susan Mathis, female    DOB: 1970-10-22  Age: 49 y.o. MRN: [id number]  CC:  Chief Complaint  Patient presents with   Follow-up   Back Pain   Arm Pain    HPI Susan Mathis presents for follow up. She  has a past medical history of Fibroids, Hypertension, and Migraines.   Hypertension Patient is here for follow-up of elevated blood pressure. She is exercising and is not adherent to a low-salt diet.  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  She is eating late at night and lying down after eating.  Blood pressure is not monitor at home. Cardiac symptoms: chest pain. Patient denies dyspnea, irregular heart beat and lower extremity edema. Cardiovascular risk factors: hypertension, obesity (BMI >= 30 kg/m2) and sedentary lifestyle. Use of agents associated with hypertension: none. History of target organ damage: none.  Back Pain Patient presents for evaluation of low back problems. She has occasional pain from time to time. She admits that the [employer] is effective. Symptoms have not changed.   Elbow Pain Patient complains of right elbow pain. Onset of the symptoms was several months ago. Inciting event: none known. Current symptoms include: point tenderness on the outer side and swelling. Pain is aggravated by: grasping, lifting heavy objects, supination/pronation as when opening doors. Symptoms have been intermittent. Patient has had no prior elbow problems. Evaluation to date: none. Treatment to date: OTC analgesics.  Past Medical History:  Diagnosis Date   Fibroids    Hypertension    Migraines     Past Surgical History:  Procedure Laterality Date   CESAREAN SECTION      Family History  Problem Relation Age of Onset   Hypertension Mother    Hypertension Father      Social History   Socioeconomic History   Marital status: Single    Spouse name: Not on file   Number of children: Not on file   Years of education: Not on file   Highest education level: Not on file  Occupational History   Not on file  Tobacco Use   Smoking status: Never Smoker   Smokeless tobacco: Never Used  Vaping Use   Vaping Use: Never used  Substance and Sexual Activity   Alcohol use: Not Currently   Drug use: Never   Sexual activity: Not on file  Other Topics Concern   Not on file  Social History Narrative   Not on file   Social Determinants of Health   Financial Resource Strain:    Difficulty of Paying Living Expenses: Not on file  Food Insecurity:    Worried About [place] in the Last Year: Not on file   Ran Out of Food in the Last Year: Not on file  Transportation Needs:    Lack of Transportation (Medical): Not on file   Lack of Transportation (Non-Medical): Not on file  Physical Activity:    Days of Exercise per Week: Not on file   Minutes of Exercise per Session: Not on file  Stress:    Feeling of Stress : Not on file  Social Connections:    Frequency of Communication with Friends and Family: Not on file   Frequency of Social Gatherings with Friends and Family: Not on file  Attends Religious Services: Not on file   Active Member of Clubs or Organizations: Not on file   Attends Archivist Meetings: Not on file   Marital Status: Not on file  Intimate Partner Violence:    Fear of Current or Ex-Partner: Not on file   Emotionally Abused: Not on file   Physically Abused: Not on file   Sexually Abused: Not on file    Outpatient Medications Prior to Visit  Medication Sig Dispense Refill   amLODipine (NORVASC) 10 MG tablet Take 1 tablet (10 mg total) by mouth daily. 90 tablet 3   Aspirin-Acetaminophen-Caffeine (GOODY HEADACHE PO) Take by mouth.     Ferrous Fumarate (HEMOCYTE - 106 MG FE) 324 (106  Fe) MG TABS tablet Take 1 tablet (106 mg of iron total) by mouth daily. (Patient not taking: Reported on 03/24/2020) 90 tablet 0   hydrochlorothiazide (HYDRODIURIL) 25 MG tablet Take 1 tablet (25 mg total) by mouth daily. 90 tablet 3   ibuprofen (ADVIL) 800 MG tablet TAKE 1 TABLET BY MOUTH TWICE DAILY WITH MEALS AS NEEDED FOR PAIN 30 tablet 1   No facility-administered medications prior to visit.    No Known Allergies  ROS Review of Systems  Constitutional: Negative.   HENT: Negative.   Eyes: Negative.   Respiratory: Negative for shortness of breath.   Cardiovascular: Positive for chest pain (she has occasional sharp pain on the left chest but no change no concurrent symptoms).  Gastrointestinal: Positive for nausea (occasional). Negative for constipation and diarrhea.  Endocrine: Negative.   Genitourinary:       Nocturia due to drinking water all during the night to help with dry mouth  Skin: Negative.   Allergic/Immunologic: Negative.   Neurological: Positive for dizziness and light-headedness (ocassional ).  Hematological: Negative.       Objective:    Physical Exam Constitutional:      Appearance: She is obese.  HENT:     Head: Normocephalic and atraumatic.     Nose: Nose normal.     Mouth/Throat:     Mouth: Mucous membranes are moist.  Cardiovascular:     Rate and Rhythm: Normal rate and regular rhythm.     Pulses: Normal pulses.     Heart sounds: Normal heart sounds.  Pulmonary:     Effort: Pulmonary effort is normal.     Comments: diminshed Abdominal:     Palpations: Abdomen is soft.  Musculoskeletal:     Right elbow: Swelling present. Decreased range of motion. Tenderness present.     Cervical back: Normal range of motion.  Skin:    Capillary Refill: Capillary refill takes less than 2 seconds.  Neurological:     General: No focal deficit present.     Mental Status: She is alert and oriented to person, place, and time.  Psychiatric:        Mood and  Affect: Mood normal.        Behavior: Behavior normal.        Thought Content: Thought content normal.        Judgment: Judgment normal.     BP 137/90 (BP Location: Right Arm, Patient Position: Sitting, Cuff Size: Normal)    Pulse 97    Temp 97.9 F (36.6 C)    Resp 17    Ht 5\' 5"  (1.651 m)    Wt 224 lb 3.2 oz (101.7 kg)    LMP 05/27/2020    SpO2 99%    BMI 37.31 kg/m  Wt Readings from Last 3 Encounters:  06/24/20 224 lb 3.2 oz (101.7 kg)  03/24/20 215 lb (97.5 kg)  03/12/20 189 lb (85.7 kg)     Health Maintenance Due  Topic Date Due   MAMMOGRAM  Never done   COLONOSCOPY  Never done    There are no preventive care reminders to display for this patient.  Lab Results  Component Value Date   TSH 1.600 03/24/2020   Lab Results  Component Value Date   WBC 7.4 03/24/2020   HGB 13.8 03/24/2020   HCT 42.8 03/24/2020   MCV 84 03/24/2020   PLT 286 03/24/2020   Lab Results  Component Value Date   NA 136 03/24/2020   K 3.7 03/24/2020   CO2 26 03/12/2020   GLUCOSE 166 (H) 03/24/2020   BUN 15 03/24/2020   CREATININE 0.59 03/24/2020   BILITOT 0.4 03/24/2020   ALKPHOS 61 03/24/2020   AST 16 03/24/2020   ALT 18 03/12/2020   PROT 7.9 03/24/2020   ALBUMIN 4.8 03/24/2020   CALCIUM 10.3 (H) 03/24/2020   ANIONGAP 12 03/12/2020   Lab Results  Component Value Date   CHOL 155 02/17/2018   Lab Results  Component Value Date   HDL 61 02/17/2018   Lab Results  Component Value Date   LDLCALC 82 02/17/2018   Lab Results  Component Value Date   TRIG 58 02/17/2018   Lab Results  Component Value Date   CHOLHDL 2.5 02/17/2018   Lab Results  Component Value Date   HGBA1C 5.4 02/17/2018      Assessment & Plan:   Problem List Items Addressed This Visit      Cardiovascular and Mediastinum   Essential hypertension - Primary Encouraged on going compliance with current medication regimen Encouraged home monitoring and recording BP <130/80 Eating a heart-healthy diet  with less salt Encouraged regular physical activity  Recommend Weight loss.  Discussed weight gain and hypertension   Relevant Orders   Comp. Metabolic Panel (12)     Other   Anemia   Relevant Orders   Lipid panel    Other Visit Diagnoses    Encounter for screening mammogram for malignant neoplasm of breast       Relevant Orders   MM DIGITAL SCREENING BILATERAL   Screening for colorectal cancer       Relevant Orders   Ambulatory referral to Gastroenterology   Elbow pain, right     Salonpas with elbow sleeve, to continue topical agents as needed If symptoms do not resolve call for evaluation with x-ray   Obesity, Class II, BMI 35-39.9    Encourage weight loss and changing current eating habits.  Eating more for lunch and a snack for dinner or late night      No orders of the defined types were placed in this encounter.   Follow-up: Return in about 3 months (around 09/24/2020).    [name], NP

## 2020-06-25 LAB — COMP. METABOLIC PANEL (12)
AST: 12 IU/L (ref 0–40)
Albumin/Globulin Ratio: 1.4 (ref 1.2–2.2)
Albumin: 4.6 g/dL (ref 3.8–4.8)
Alkaline Phosphatase: 56 IU/L (ref 44–121)
BUN/Creatinine Ratio: 21 (ref 9–23)
BUN: 13 mg/dL (ref 6–24)
Bilirubin Total: 0.2 mg/dL (ref 0.0–1.2)
Calcium: 9.6 mg/dL (ref 8.7–10.2)
Chloride: 98 mmol/L (ref 96–106)
Creatinine, Ser: 0.61 mg/dL (ref 0.57–1.00)
GFR calc Af Amer: 122 mL/min/{1.73_m2} (ref >59)
GFR calc non Af Amer: 106 mL/min/{1.73_m2} (ref >59)
Globulin, Total: 3.2 g/dL (ref 1.5–4.5)
Glucose: 192 mg/dL — ABNORMAL HIGH (ref 65–99)
Potassium: 3.7 mmol/L (ref 3.5–5.2)
Sodium: 137 mmol/L (ref 134–144)
Total Protein: 7.8 g/dL (ref 6.0–8.5)

## 2020-06-25 LAB — LIPID PANEL
Chol/HDL Ratio: 4.4 ratio (ref 0.0–4.4)
Cholesterol, Total: 223 mg/dL — ABNORMAL HIGH (ref 100–199)
HDL: 51 mg/dL (ref >39)
LDL Chol Calc (NIH): 150 mg/dL — ABNORMAL HIGH (ref 0–99)
Triglycerides: 121 mg/dL (ref 0–149)
VLDL Cholesterol Cal: 22 mg/dL (ref 5–40)

## 2020-06-26 NOTE — Addendum Note (Signed)
Addended by: [name] on: 06/26/2020 08:39 AM   Modules accepted: Orders

## 2020-06-30 ENCOUNTER — Ambulatory Visit: Payer: Medicaid Other | Admitting: Nurse Practitioner

## 2020-12-22 ENCOUNTER — Ambulatory Visit (INDEPENDENT_AMBULATORY_CARE_PROVIDER_SITE_OTHER): Payer: Self-pay | Admitting: Nurse Practitioner

## 2020-12-22 ENCOUNTER — Ambulatory Visit (HOSPITAL_COMMUNITY)
Admission: RE | Admit: 2020-12-22 | Discharge: 2020-12-22 | Disposition: A | Discharge status: Home / Self Care | Payer: Medicaid Other | Source: Ambulatory Visit | Attending: Nurse Practitioner | Admitting: Nurse Practitioner

## 2020-12-22 ENCOUNTER — Encounter: Payer: Self-pay | Admitting: Nurse Practitioner

## 2020-12-22 ENCOUNTER — Other Ambulatory Visit: Payer: Self-pay

## 2020-12-22 VITALS — BP 141/88 | HR 84 | Temp 98.2°F | Ht 65.0 in | Wt 243.0 lb

## 2020-12-22 DIAGNOSIS — E1165 Type 2 diabetes mellitus with hyperglycemia: Secondary | ICD-10-CM

## 2020-12-22 DIAGNOSIS — M25521 Pain in right elbow: Secondary | ICD-10-CM | POA: Insufficient documentation

## 2020-12-22 DIAGNOSIS — E7800 Pure hypercholesterolemia, unspecified: Secondary | ICD-10-CM

## 2020-12-22 DIAGNOSIS — I10 Essential (primary) hypertension: Secondary | ICD-10-CM

## 2020-12-22 DIAGNOSIS — E119 Type 2 diabetes mellitus without complications: Secondary | ICD-10-CM

## 2020-12-22 DIAGNOSIS — Z131 Encounter for screening for diabetes mellitus: Secondary | ICD-10-CM

## 2020-12-22 LAB — POCT URINALYSIS DIPSTICK
Bilirubin, UA: NEGATIVE
Blood, UA: NEGATIVE
Clarity, UA: NEGATIVE
Glucose, UA: NEGATIVE
Ketones, UA: NEGATIVE
Leukocytes, UA: NEGATIVE
Nitrite, UA: NEGATIVE
Protein, UA: POSITIVE — AB
Spec Grav, UA: 1.025 (ref 1.010–1.025)
Urobilinogen, UA: 1 E.U./dL
pH, UA: 6 (ref 5.0–8.0)

## 2020-12-22 MED ORDER — TRUE METRIX BLOOD GLUCOSE TEST VI STRP
ORAL_STRIP | 12 refills | Status: AC
Start: 2020-12-22 — End: ?
  Filled 2020-12-22: qty 100, 25d supply, fill #0
  Filled 2021-01-17: qty 100, 25d supply, fill #1

## 2020-12-22 MED ORDER — METFORMIN HCL 500 MG PO TABS
500.0000 mg | ORAL_TABLET | Freq: Two times a day (BID) | ORAL | 3 refills | Status: DC
Start: 2020-12-22 — End: 2022-01-01
  Filled 2020-12-22: qty 60, 30d supply, fill #0
  Filled 2021-01-17: qty 60, 30d supply, fill #1
  Filled 2021-02-06: qty 60, 30d supply, fill #2
  Filled 2021-02-27: qty 120, 30d supply, fill #2
  Filled 2021-02-27: qty 60, 30d supply, fill #2
  Filled 2021-05-05: qty 120, 30d supply, fill #3
  Filled 2021-06-25: qty 120, 30d supply, fill #4
  Filled 2021-09-02: qty 120, 30d supply, fill #5
  Filled 2021-09-04: qty 120, 30d supply, fill #0
  Filled 2021-11-02: qty 120, 30d supply, fill #1

## 2020-12-22 MED ORDER — TRUE METRIX METER W/DEVICE KIT
1.0000 | PACK | Freq: Four times a day (QID) | 0 refills | Status: AC
Start: 2020-12-22 — End: ?
  Filled 2020-12-22: qty 1, 1d supply, fill #0

## 2020-12-22 MED ORDER — TRUEPLUS LANCETS 28G MISC
1.0000 | Freq: Four times a day (QID) | 11 refills | Status: AC
  Filled 2020-12-22: qty 100, 25d supply, fill #0

## 2020-12-22 NOTE — Progress Notes (Signed)
[facility] Patient [facility] [address] [place], [place]  [address] Phone:  [phone]   Fax:  [phone]   Established Patient Office Visit  Subjective:  Patient ID: Susan Mathis, female    DOB: [birth year]  Age: 50 y.o. MRN: [id number]  CC:  Chief Complaint  Patient presents with  . Follow-up    Follow up , 6 month follow up , having swelling in rt arm, having swelling and aching pain , pt having stomach pain she has call her ob[facility]gyn [facility]     HPI Susan Mathis presents for follow up. She  has a past medical history of Fibroids, Hypertension, and Migraines.   Patient complains of right elbow pain. Onset of the symptoms was several months ago. Inciting event: none known. She admits that this was brought on by  Current symptoms include: point tenderness on the outer side and swelling.  She describes it as constant aching painPain is aggravated by: grasping, lifting heavy objects, supination/pronation as when opening doors. Patient has had no prior elbow problems. Evaluation to date: none. Treatment to date: OTC analgesics. She rates the pain a 5/10.  Hypertension Patient is here for follow-up of elevated blood pressure. She is not exercising and is adherent to a low-salt diet. Blood pressure is not monitored at home.  Cardiovascular risk factors: hypertension, obesity (BMI >= 30 kg/m2), and sedentary lifestyle. Use of agents associated with hypertension: NSAIDS. History of target organ damage: none.  She has experienced a 20 pound weight gain.  She having increased stress due to her mother being hospitalized for a fall.  She is to undergo surgery today.  Past Medical History:  Diagnosis Date  . Fibroids   . Hypertension   . Migraines     Past Surgical History:  Procedure Laterality Date  . CESAREAN SECTION      Family History  Problem Relation Age of Onset  . Hypertension Mother   . Hypertension Father     Social History   Socioeconomic  History  . Marital status: Single    Spouse name: Not on file  . Number of children: Not on file  . Years of education: Not on file  . Highest education level: Not on file  Occupational History  . Not on file  Tobacco Use  . Smoking status: Never Smoker  . Smokeless tobacco: Never Used  Vaping Use  . Vaping Use: Never used  Substance and Sexual Activity  . Alcohol use: Not Currently  . Drug use: Never  . Sexual activity: Not on file  Other Topics Concern  . Not on file  Social History Narrative  . Not on file   Social Determinants of Health   Financial Resource Strain: Not on file  Food Insecurity: Not on file  Transportation Needs: Not on file  Physical Activity: Not on file  Stress: Not on file  Social Connections: Not on file  Intimate Partner Violence: Not on file    Outpatient Medications Prior to Visit  Medication Sig Dispense Refill  . amLODipine (NORVASC) 10 MG tablet Take 1 tablet (10 mg total) by mouth daily. 90 tablet 3  . Aspirin-Acetaminophen-Caffeine (GOODY HEADACHE PO) Take by mouth.    . Ferrous Fumarate (HEMOCYTE - 106 MG FE) 324 (106 Fe) MG TABS tablet Take 1 tablet (106 mg of iron total) by mouth daily. 90 tablet 0  . hydrochlorothiazide (HYDRODIURIL) 25 MG tablet Take 1 tablet (25 mg total) by mouth daily.  90 tablet 3  . ibuprofen (ADVIL) 800 MG tablet TAKE 1 TABLET BY MOUTH TWICE DAILY WITH MEALS AS NEEDED FOR PAIN (Patient not taking: Reported on 12/22/2020) 30 tablet 1   No facility-administered medications prior to visit.    No Known Allergies  ROS Review of Systems    Objective:    Physical Exam Constitutional:      Appearance: She is obese.  HENT:     Head: Normocephalic and atraumatic.     Nose: Nose normal.     Mouth/Throat:     Mouth: Mucous membranes are moist.  Cardiovascular:     Rate and Rhythm: Normal rate.     Pulses: Normal pulses.     Heart sounds: Normal heart sounds.  Pulmonary:     Effort: Pulmonary effort is  normal.     Breath sounds: Normal breath sounds.  Abdominal:     Palpations: Abdomen is soft.  Musculoskeletal:        General: Swelling and tenderness present.     Cervical back: Normal range of motion.     Right lower leg: No edema.     Left lower leg: No edema.  Skin:    General: Skin is warm and dry.     Capillary Refill: Capillary refill takes less than 2 seconds.  Neurological:     General: No focal deficit present.     Mental Status: She is alert and oriented to person, place, and time.  Psychiatric:        Mood and Affect: Mood normal.        Behavior: Behavior normal.        Thought Content: Thought content normal.        Judgment: Judgment normal.     BP (!) 141/88 (BP Location: Left Arm, Patient Position: Sitting, Cuff Size: Large)   Pulse 84   Temp 98.2 F (36.8 C) (Temporal)   Ht $R'5\' 5"'Tm$  (1.651 m)   Wt 243 lb (110.2 kg)   LMP 12/15/2020   SpO2 98%   BMI 40.44 kg/m  Wt Readings from Last 3 Encounters:  12/22/20 243 lb (110.2 kg)  06/24/20 224 lb 3.2 oz (101.7 kg)  03/24/20 215 lb (97.5 kg)     There are no preventive care reminders to display for this patient.  There are no preventive care reminders to display for this patient.  Lab Results  Component Value Date   TSH 1.600 03/24/2020   Lab Results  Component Value Date   WBC 7.4 03/24/2020   HGB 13.8 03/24/2020   HCT 42.8 03/24/2020   MCV 84 03/24/2020   PLT 286 03/24/2020   Lab Results  Component Value Date   NA 137 06/24/2020   K 3.7 06/24/2020   CO2 26 03/12/2020   GLUCOSE 192 (H) 06/24/2020   BUN 13 06/24/2020   CREATININE 0.61 06/24/2020   BILITOT 0.2 06/24/2020   ALKPHOS 56 06/24/2020   AST 12 06/24/2020   ALT 18 03/12/2020   PROT 7.8 06/24/2020   ALBUMIN 4.6 06/24/2020   CALCIUM 9.6 06/24/2020   ANIONGAP 12 03/12/2020   Lab Results  Component Value Date   CHOL 223 (H) 06/24/2020   Lab Results  Component Value Date   HDL 51 06/24/2020   Lab Results  Component Value  Date   LDLCALC 150 (H) 06/24/2020   Lab Results  Component Value Date   TRIG 121 06/24/2020   Lab Results  Component Value Date   CHOLHDL 4.4 06/24/2020  Lab Results  Component Value Date   HGBA1C 5.4 02/17/2018      Assessment & Plan:   Problem List Items Addressed This Visit      Cardiovascular and Mediastinum   Essential hypertension - Primary Encouraged on going compliance with current medication regimen Encouraged home monitoring and recording BP <130/80 Eating a heart-healthy diet with less salt Encouraged regular physical activity  Recommend Weight loss   Relevant Orders   POCT Urinalysis Dipstick (Completed)   Comp. Metabolic Panel (12) (Completed)    Other Visit Diagnoses    Elbow pain, right       Relevant Orders   DG ELBOW COMPLETE RIGHT (3+VIEW)   Screening for diabetes mellitus (DM)     8.3%'   Relevant Orders   POCT glycosylated hemoglobin (Hb A1C)   Elevated cholesterol       Relevant Orders   Lipid panel (Completed)   Uncontrolled type 2 diabetes mellitus with hyperglycemia (HCC)     New onset diabetes Encourage compliance with current treatment regimen  Dose adjustment metformin 500 mg twice daily x1 week then increase to 1000 mg twice daily if tolerated follow-up in 4 weeks Encourage regular CBG monitoring Encourage contacting office if excessive hyperglycemia and or hypoglycemia Lifestyle modification with healthy diet (fewer calories, more high fiber foods, whole grains and non-starchy vegetables, lower fat meat and fish, low-fat diary include healthy oils) regular exercise (physical activity) and weight loss    Relevant Medications   metFORMIN (GLUCOPHAGE) 500 MG tablet      Meds ordered this encounter  Medications  . metFORMIN (GLUCOPHAGE) 500 MG tablet    Sig: Take 1 tablet (500 mg total) by mouth 2 (two) times daily with a meal. Week 2 tab twice a day    Dispense:  180 tablet    Refill:  3    Order Specific Question:   Supervising  Provider    Answer:   Tresa Garter [id number]  . Blood Glucose Monitoring Suppl (TRUE METRIX METER) w/Device KIT    Sig: 1 kit by Does not apply route in the morning, at noon, in the evening, and at bedtime.    Dispense:  1 kit    Refill:  0    Order Specific Question:   Supervising Provider    Answer:   Tresa Garter [id number]  . TRUEplus Lancets 28G MISC    Sig: use as directed in the morning, at noon, in the evening, and at bedtime.    Dispense:  210 each    Refill:  11    Order Specific Question:   Supervising Provider    Answer:   Tresa Garter [id number]  . glucose blood (TRUE METRIX BLOOD GLUCOSE TEST) test strip    Sig: Use as instructed    Dispense:  100 each    Refill:  12    Order Specific Question:   Supervising Provider    Answer:   Tresa Garter [[id number]]    Follow-up: Return in about 4 weeks (around 01/19/2021) for follow up DM [id number].    [name], NP

## 2020-12-22 NOTE — Patient Instructions (Addendum)
Managing Your Hypertension Hypertension, also called high blood pressure, is when the force of the blood pressing against the walls of the arteries is too strong. Arteries are blood vessels that carry blood from your heart throughout your body. Hypertension forces the heart to work harder to pump blood and may cause the arteries to become narrow or stiff. Understanding blood pressure readings Your personal target blood pressure may vary depending on your medical conditions, your age, and other factors. A blood pressure reading includes a higher number over a lower number. Ideally, your blood pressure should be below 120/80. You should know that:  The first, or top, number is called the systolic pressure. It is a measure of the pressure in your arteries as your heart beats.  The second, or bottom number, is called the diastolic pressure. It is a measure of the pressure in your arteries as the heart relaxes. Blood pressure is classified into four stages. Based on your blood pressure reading, your health care provider may use the following stages to determine what type of treatment you need, if any. Systolic pressure and diastolic pressure are measured in a unit called mmHg. Normal  Systolic pressure: below 120.  Diastolic pressure: below 80. Elevated  Systolic pressure: 120-129.  Diastolic pressure: below 80. Hypertension stage 1  Systolic pressure: 130-139.  Diastolic pressure: 80-89. Hypertension stage 2  Systolic pressure: 140 or above.  Diastolic pressure: 90 or above. How can this condition affect me? Managing your hypertension is an important responsibility. Over time, hypertension can damage the arteries and decrease blood flow to important parts of the body, including the brain, heart, and kidneys. Having untreated or uncontrolled hypertension can lead to:  A heart attack.  A stroke.  A weakened blood vessel (aneurysm).  Heart failure.  Kidney damage.  Eye  damage.  Metabolic syndrome.  Memory and concentration problems.  Vascular dementia. What actions can I take to manage this condition? Hypertension can be managed by making lifestyle changes and possibly by taking medicines. Your health care provider will help you make a plan to bring your blood pressure within a normal range. Nutrition  Eat a diet that is high in fiber and potassium, and low in salt (sodium), added sugar, and fat. An example eating plan is called the Dietary Approaches to Stop Hypertension (DASH) diet. To eat this way: ? Eat plenty of fresh fruits and vegetables. Try to fill one-half of your plate at each meal with fruits and vegetables. ? Eat whole grains, such as whole-wheat pasta, brown rice, or whole-grain bread. Fill about one-fourth of your plate with whole grains. ? Eat low-fat dairy products. ? Avoid fatty cuts of meat, processed or cured meats, and poultry with skin. Fill about one-fourth of your plate with lean proteins such as fish, chicken without skin, beans, eggs, and tofu. ? Avoid pre-made and processed foods. These tend to be higher in sodium, added sugar, and fat.  Reduce your daily sodium intake. Most people with hypertension should eat less than 1,500 mg of sodium a day.   Lifestyle  Work with your health care provider to maintain a healthy body weight or to lose weight. Ask what an ideal weight is for you.  Get at least 30 minutes of exercise that causes your heart to beat faster (aerobic exercise) most days of the week. Activities may include walking, swimming, or biking.  Include exercise to strengthen your muscles (resistance exercise), such as weight lifting, as part of your weekly exercise routine. Try   to do these types of exercises for 30 minutes at least 3 days a week.  Do not use any products that contain nicotine or tobacco, such as cigarettes, e-cigarettes, and chewing tobacco. If you need help quitting, ask your health care  provider.  Control any long-term (chronic) conditions you have, such as high cholesterol or diabetes.  Identify your sources of stress and find ways to manage stress. This may include meditation, deep breathing, or making time for fun activities.   Alcohol use  Do not drink alcohol if: ? Your health care provider tells you not to drink. ? You are pregnant, may be pregnant, or are planning to become pregnant.  If you drink alcohol: ? Limit how much you use to:  0-1 drink a day for women.  0-2 drinks a day for men. ? Be aware of how much alcohol is in your drink. In the [place] one drink equals one 12 oz bottle of beer (355 mL), one 5 oz glass of wine (148 mL), or one 1 oz glass of hard liquor (44 mL). Medicines Your health care provider may prescribe medicine if lifestyle changes are not enough to get your blood pressure under control and if:  Your systolic blood pressure is 130 or higher.  Your diastolic blood pressure is 80 or higher. Take medicines only as told by your health care provider. Follow the directions carefully. Blood pressure medicines must be taken as told by your health care provider. The medicine does not work as well when you skip doses. Skipping doses also puts you at risk for problems. Monitoring Before you monitor your blood pressure:  Do not smoke, drink caffeinated beverages, or exercise within 30 minutes before taking a measurement.  Use the bathroom and empty your bladder (urinate).  Sit quietly for at least 5 minutes before taking measurements. Monitor your blood pressure at home as told by your health care provider. To do this:  Sit with your back straight and supported.  Place your feet flat on the floor. Do not cross your legs.  Support your arm on a flat surface, such as a table. Make sure your upper arm is at heart level.  Each time you measure, take two or three readings one minute apart and record the results. You may also need to have your  blood pressure checked regularly by your health care provider.   General information  Talk with your health care provider about your diet, exercise habits, and other lifestyle factors that may be contributing to hypertension.  Review all the medicines you take with your health care provider because there may be side effects or interactions.  Keep all visits as told by your health care provider. Your health care provider can help you create and adjust your plan for managing your high blood pressure. Where to find more information  National Heart, Lung, and Blood Institute: www.nhlbi.nih.gov  American Heart Association: www.heart.org Contact a health care provider if:  You think you are having a reaction to medicines you have taken.  You have repeated (recurrent) headaches.  You feel dizzy.  You have swelling in your ankles.  You have trouble with your vision. Get help right away if:  You develop a severe headache or confusion.  You have unusual weakness or numbness, or you feel faint.  You have severe pain in your chest or abdomen.  You vomit repeatedly.  You have trouble breathing. These symptoms may represent a serious problem that is an emergency. Do not wait   to see if the symptoms will go away. Get medical help right away. Call your local emergency services (911 in the [place] Do not drive yourself to the hospital. Summary  Hypertension is when the force of blood pumping through your arteries is too strong. If this condition is not controlled, it may put you at risk for serious complications.  Your personal target blood pressure may vary depending on your medical conditions, your age, and other factors. For most people, a normal blood pressure is less than 120/80.  Hypertension is managed by lifestyle changes, medicines, or both.  Lifestyle changes to help manage hypertension include losing weight, eating a healthy, low-sodium diet, exercising more, stopping smoking, and  limiting alcohol. This information is not intended to replace advice given to you by your health care provider. Make sure you discuss any questions you have with your health care provider. Document Revised: 09/11/2019 Document Reviewed: 07/07/2019 Elsevier Patient Education  2021 [place].    Diabetes Mellitus and Nutrition, Adult When you have diabetes, or diabetes mellitus, it is very important to have healthy eating habits because your blood sugar (glucose) levels are greatly affected by what you eat and drink. Eating healthy foods in the right amounts, at about the same times every day, can help you:  Control your blood glucose.  Lower your risk of heart disease.  Improve your blood pressure.  Reach or maintain a healthy weight. What can affect my meal plan? Every person with diabetes is different, and each person has different needs for a meal plan. Your health care provider may recommend that you work with a dietitian to make a meal plan that is best for you. Your meal plan may vary depending on factors such as:  The calories you need.  The medicines you take.  Your weight.  Your blood glucose, blood pressure, and cholesterol levels.  Your activity level.  Other health conditions you have, such as heart or kidney disease. How do carbohydrates affect me? Carbohydrates, also called carbs, affect your blood glucose level more than any other type of food. Eating carbs naturally raises the amount of glucose in your blood. Carb counting is a method for keeping track of how many carbs you eat. Counting carbs is important to keep your blood glucose at a healthy level, especially if you use insulin or take certain oral diabetes medicines. It is important to know how many carbs you can safely have in each meal. This is different for every person. Your dietitian can help you calculate how many carbs you should have at each meal and for each snack. How does alcohol affect  me? Alcohol can cause a sudden decrease in blood glucose (hypoglycemia), especially if you use insulin or take certain oral diabetes medicines. Hypoglycemia can be a life-threatening condition. Symptoms of hypoglycemia, such as sleepiness, dizziness, and confusion, are similar to symptoms of having too much alcohol.  Do not drink alcohol if: ? Your health care provider tells you not to drink. ? You are pregnant, may be pregnant, or are planning to become pregnant.  If you drink alcohol: ? Do not drink on an empty stomach. ? Limit how much you use to:  0-1 drink a day for women.  0-2 drinks a day for men. ? Be aware of how much alcohol is in your drink. In the [place] one drink equals one 12 oz bottle of beer (355 mL), one 5 oz glass of wine (148 mL), or one 1 oz glass of hard  liquor (44 mL). ? Keep yourself hydrated with water, diet soda, or unsweetened iced tea.  Keep in mind that regular soda, juice, and other mixers may contain a lot of sugar and must be counted as carbs. What are tips for following this plan? Reading food labels  Start by checking the serving size on the "Nutrition Facts" label of packaged foods and drinks. The amount of calories, carbs, fats, and other nutrients listed on the label is based on one serving of the item. Many items contain more than one serving per package.  Check the total grams (g) of carbs in one serving. You can calculate the number of servings of carbs in one serving by dividing the total carbs by 15. For example, if a food has 30 g of total carbs per serving, it would be equal to 2 servings of carbs.  Check the number of grams (g) of saturated fats and trans fats in one serving. Choose foods that have a low amount or none of these fats.  Check the number of milligrams (mg) of salt (sodium) in one serving. Most people should limit total sodium intake to less than 2,300 mg per day.  Always check the nutrition information of foods labeled as  "low-fat" or "nonfat." These foods may be higher in added sugar or refined carbs and should be avoided.  Talk to your dietitian to identify your daily goals for nutrients listed on the label. Shopping  Avoid buying canned, pre-made, or processed foods. These foods tend to be high in fat, sodium, and added sugar.  Shop around the outside edge of the grocery store. This is where you will most often find fresh fruits and vegetables, bulk grains, fresh meats, and fresh dairy. Cooking  Use low-heat cooking methods, such as baking, instead of high-heat cooking methods like deep frying.  Cook using healthy oils, such as olive, canola, or sunflower oil.  Avoid cooking with butter, cream, or high-fat meats. Meal planning  Eat meals and snacks regularly, preferably at the same times every day. Avoid going long periods of time without eating.  Eat foods that are high in fiber, such as fresh fruits, vegetables, beans, and whole grains. Talk with your dietitian about how many servings of carbs you can eat at each meal.  Eat 4-6 oz (112-168 g) of lean protein each day, such as lean meat, chicken, fish, eggs, or tofu. One ounce (oz) of lean protein is equal to: ? 1 oz (28 g) of meat, chicken, or fish. ? 1 egg. ?  cup (62 g) of tofu.  Eat some foods each day that contain healthy fats, such as avocado, nuts, seeds, and fish.   What foods should I eat? Fruits Berries. Apples. Oranges. Peaches. Apricots. Plums. Grapes. Mango. Papaya. Pomegranate. Kiwi. Cherries. Vegetables Lettuce. Spinach. Leafy greens, including kale, chard, collard greens, and mustard greens. Beets. Cauliflower. Cabbage. Broccoli. Carrots. Green beans. Tomatoes. Peppers. Onions. Cucumbers. Brussels sprouts. Grains Whole grains, such as whole-wheat or whole-grain bread, crackers, tortillas, cereal, and pasta. Unsweetened oatmeal. Quinoa. Brown or wild rice. Meats and other proteins Seafood. Poultry without skin. Lean cuts of  poultry and beef. Tofu. Nuts. Seeds. Dairy Low-fat or fat-free dairy products such as milk, yogurt, and cheese. The items listed above may not be a complete list of foods and beverages you can eat. Contact a dietitian for more information. What foods should I avoid? Fruits Fruits canned with syrup. Vegetables Canned vegetables. Frozen vegetables with butter or cream sauce. Grains Refined white  flour and flour products such as bread, pasta, snack foods, and cereals. Avoid all processed foods. Meats and other proteins Fatty cuts of meat. Poultry with skin. Breaded or fried meats. Processed meat. Avoid saturated fats. Dairy Full-fat yogurt, cheese, or milk. Beverages Sweetened drinks, such as soda or iced tea. The items listed above may not be a complete list of foods and beverages you should avoid. Contact a dietitian for more information. Questions to ask a health care provider  Do I need to meet with a diabetes educator?  Do I need to meet with a dietitian?  What number can I call if I have questions?  When are the best times to check my blood glucose? Where to find more information:  American Diabetes Association: diabetes.org  Academy of Nutrition and Dietetics: www.eatright.[employer] of Diabetes and Digestive and Kidney Diseases: [employer]  Association of Diabetes Care and Education Specialists: www.diabeteseducator.org Summary  It is important to have healthy eating habits because your blood sugar (glucose) levels are greatly affected by what you eat and drink.  A healthy meal plan will help you control your blood glucose and maintain a healthy lifestyle.  Your health care provider may recommend that you work with a dietitian to make a meal plan that is best for you.  Keep in mind that carbohydrates (carbs) and alcohol have immediate effects on your blood glucose levels. It is important to count carbs and to use alcohol carefully. This  information is not intended to replace advice given to you by your health care provider. Make sure you discuss any questions you have with your health care provider. Document Revised: 07/14/2019 Document Reviewed: 07/14/2019 Elsevier Patient Education  [place].   Diabetes Mellitus Basics  Diabetes mellitus, or diabetes, is a long-term (chronic) disease. It occurs when the body does not properly use sugar (glucose) that is released from food after you eat. Diabetes mellitus may be caused by one or both of these problems:  Your pancreas does not make enough of a hormone called insulin.  Your body does not react in a normal way to the insulin that it makes. Insulin lets glucose enter cells in your body. This gives you energy. If you have diabetes, glucose cannot get into cells. This causes high blood glucose (hyperglycemia). How to treat and manage diabetes You may need to take insulin or other diabetes medicines daily to keep your glucose in balance. If you are prescribed insulin, you will learn how to give yourself insulin by injection. You may need to adjust the amount of insulin you take based on the foods that you eat. You will need to check your blood glucose levels using a glucose monitor as told by your health care provider. The readings can help determine if you have low or high blood glucose. Generally, you should have these blood glucose levels:  Before meals (preprandial): 80-130 mg/dL (4.4-7.2 mmol/L).  After meals (postprandial): below 180 mg/dL (10 mmol/L).  Hemoglobin A1c (HbA1c) level: less than 7%. Your health care provider will set treatment goals for you. Keep all follow-up visits. This is important. Follow these instructions at home: Diabetes medicines Take your diabetes medicines every day as told by your health care provider. List your diabetes medicines here:  Name of medicine: ______________________________ ? Amount (dose): _______________ Time  (a.m./p.m.): _______________ Notes: ___________________________________  Name of medicine: ______________________________ ? Amount (dose): _______________ Time (a.m./p.m.): _______________ Notes: ___________________________________  Name of medicine: ______________________________ ? Amount (dose): _______________ Time (a.m./p.m.): _______________  Notes: ___________________________________ Insulin If you use insulin, list the types of insulin you use here:  Insulin type: ______________________________ ? Amount (dose): _______________ Time (a.m./p.m.): _______________Notes: ___________________________________  Insulin type: ______________________________ ? Amount (dose): _______________ Time (a.m./p.m.): _______________ Notes: ___________________________________  Insulin type: ______________________________ ? Amount (dose): _______________ Time (a.m./p.m.): _______________ Notes: ___________________________________  Insulin type: ______________________________ ? Amount (dose): _______________ Time (a.m./p.m.): _______________ Notes: ___________________________________  Insulin type: ______________________________ ? Amount (dose): _______________ Time (a.m./p.m.): _______________ Notes: ___________________________________ Managing blood glucose Check your blood glucose levels using a glucose monitor as told by your health care provider. Write down the times that you check your glucose levels here:  Time: _______________ Notes: ___________________________________  Time: _______________ Notes: ___________________________________  Time: _______________ Notes: ___________________________________  Time: _______________ Notes: ___________________________________  Time: _______________ Notes: ___________________________________  Time: _______________ Notes: ___________________________________   Low blood glucose Low blood glucose (hypoglycemia) is when glucose is at or below 70 mg/dL  (3.9 mmol/L). Symptoms may include:  Feeling: ? Hungry. ? Sweaty and clammy. ? Irritable or easily upset. ? Dizzy. ? Sleepy.  Having: ? A fast heartbeat. ? A headache. ? A change in your vision. ? Numbness around the mouth, lips, or tongue.  Having trouble with: ? Moving (coordination). ? Sleeping. Treating low blood glucose To treat low blood glucose, eat or drink something containing sugar right away. If you can think clearly and swallow safely, follow the 15:15 rule:  Take 15 grams of a fast-acting carb (carbohydrate), as told by your health care provider.  Some fast-acting carbs are: ? Glucose tablets: take 3-4 tablets. ? Hard candy: eat 3-5 pieces. ? Fruit juice: drink 4 oz (120 mL). ? Regular (not diet) soda: drink 4-6 oz (120-180 mL). ? Honey or sugar: eat 1 Tbsp (15 mL).  Check your blood glucose levels 15 minutes after you take the carb.  If your glucose is still at or below 70 mg/dL (3.9 mmol/L), take 15 grams of a carb again.  If your glucose does not go above 70 mg/dL (3.9 mmol/L) after 3 tries, get help right away.  After your glucose goes back to normal, eat a meal or a snack within 1 hour. Treating very low blood glucose If your glucose is at or below 54 mg/dL (3 mmol/L), you have very low blood glucose (severe hypoglycemia). This is an emergency. Do not wait to see if the symptoms will go away. Get medical help right away. Call your local emergency services (911 in the [place] Do not drive yourself to the hospital. Questions to ask your health care provider  Should I talk with a diabetes educator?  What equipment will I need to care for myself at home?  What diabetes medicines do I need? When should I take them?  How often do I need to check my blood glucose levels?  What number can I call if I have questions?  When is my follow-up visit?  Where can I find a support group for people with diabetes? Where to find more information  American  Diabetes Association: www.diabetes.org  Association of Diabetes Care and Education Specialists: www.diabeteseducator.org Contact a health care provider if:  Your blood glucose is at or above 240 mg/dL (13.3 mmol/L) for 2 days in a row.  You have been sick or have had a fever for 2 days or more, and you are not getting better.  You have any of these problems for more than 6 hours: ? You cannot eat or drink. ? You feel nauseous. ? You vomit. ?  You have diarrhea. Get help right away if:  Your blood glucose is lower than 54 mg/dL (3 mmol/L).  You get confused.  You have trouble thinking clearly.  You have trouble breathing. These symptoms may represent a serious problem that is an emergency. Do not wait to see if the symptoms will go away. Get medical help right away. Call your local emergency services (911 in the [place] Do not drive yourself to the hospital. Summary  Diabetes mellitus is a chronic disease that occurs when the body does not properly use sugar (glucose) that is released from food after you eat.  Take insulin and diabetes medicines as told.  Check your blood glucose every day, as often as told.  Keep all follow-up visits. This is important. This information is not intended to replace advice given to you by your health care provider. Make sure you discuss any questions you have with your health care provider. Document Revised: 12/08/2019 Document Reviewed: 12/08/2019 Elsevier Patient Education  2021 [employer].

## 2020-12-23 LAB — COMP. METABOLIC PANEL (12)
AST: 16 IU/L (ref 0–40)
Albumin/Globulin Ratio: 1.6 (ref 1.2–2.2)
Albumin: 4.6 g/dL (ref 3.8–4.8)
Alkaline Phosphatase: 63 IU/L (ref 44–121)
BUN/Creatinine Ratio: 28 — ABNORMAL HIGH (ref 9–23)
BUN: 15 mg/dL (ref 6–24)
Bilirubin Total: 0.3 mg/dL (ref 0.0–1.2)
Calcium: 9.7 mg/dL (ref 8.7–10.2)
Chloride: 94 mmol/L — ABNORMAL LOW (ref 96–106)
Creatinine, Ser: 0.54 mg/dL — ABNORMAL LOW (ref 0.57–1.00)
Globulin, Total: 2.9 g/dL (ref 1.5–4.5)
Glucose: 168 mg/dL — ABNORMAL HIGH (ref 65–99)
Potassium: 3.6 mmol/L (ref 3.5–5.2)
Sodium: 134 mmol/L (ref 134–144)
Total Protein: 7.5 g/dL (ref 6.0–8.5)
eGFR: 112 mL/min/{1.73_m2} (ref >59)

## 2020-12-23 LAB — LIPID PANEL
Chol/HDL Ratio: 4 ratio (ref 0.0–4.4)
Cholesterol, Total: 203 mg/dL — ABNORMAL HIGH (ref 100–199)
HDL: 51 mg/dL (ref >39)
LDL Chol Calc (NIH): 127 mg/dL — ABNORMAL HIGH (ref 0–99)
Triglycerides: 141 mg/dL (ref 0–149)
VLDL Cholesterol Cal: 25 mg/dL (ref 5–40)

## 2020-12-23 LAB — POCT GLYCOSYLATED HEMOGLOBIN (HGB A1C): HbA1c POC (<> result, manual entry): 8.3 % (ref 4.0–5.6)

## 2020-12-27 ENCOUNTER — Other Ambulatory Visit: Payer: Self-pay

## 2020-12-27 ENCOUNTER — Telehealth: Payer: Self-pay | Admitting: Nurse Practitioner

## 2020-12-27 NOTE — Telephone Encounter (Signed)
Patient said she need needles to check her sugar. Patient said everything was called in but the needles.

## 2020-12-27 NOTE — Telephone Encounter (Signed)
Patient did not receive lancing device, called pharmacy they stated patient can just stop by and pick up. Patient aware.

## 2020-12-28 ENCOUNTER — Other Ambulatory Visit: Payer: Self-pay

## 2021-01-17 ENCOUNTER — Other Ambulatory Visit: Payer: Self-pay

## 2021-01-18 ENCOUNTER — Other Ambulatory Visit: Payer: Self-pay | Admitting: Nurse Practitioner

## 2021-01-18 ENCOUNTER — Other Ambulatory Visit: Payer: Self-pay

## 2021-01-18 DIAGNOSIS — I10 Essential (primary) hypertension: Secondary | ICD-10-CM

## 2021-01-18 MED ORDER — AMLODIPINE BESYLATE 10 MG PO TABS
10.0000 mg | ORAL_TABLET | Freq: Every day | ORAL | 3 refills | Status: DC
  Filled 2021-01-18 – 2021-02-27 (×2): qty 30, 30d supply, fill #0
  Filled 2021-04-03: qty 30, 30d supply, fill #1
  Filled 2021-05-05: qty 30, 30d supply, fill #2
  Filled 2021-06-07: qty 30, 30d supply, fill #3
  Filled 2021-06-21: qty 30, 30d supply, fill #4
  Filled 2021-06-25 – 2021-08-09 (×2): qty 30, 30d supply, fill #5
  Filled 2021-09-02: qty 30, 30d supply, fill #6
  Filled 2021-09-04: qty 30, 30d supply, fill #0
  Filled 2021-10-10 – 2021-10-23 (×2): qty 30, 30d supply, fill #1
  Filled 2022-01-01: qty 30, 30d supply, fill #2

## 2021-01-18 MED ORDER — HYDROCHLOROTHIAZIDE 25 MG PO TABS
25.0000 mg | ORAL_TABLET | Freq: Every day | ORAL | 3 refills | Status: DC
  Filled 2021-01-18 – 2021-02-27 (×2): qty 30, 30d supply, fill #0
  Filled 2021-08-10: qty 30, 30d supply, fill #1
  Filled 2021-09-02: qty 30, 30d supply, fill #2
  Filled 2021-09-04: qty 30, 30d supply, fill #0
  Filled 2021-10-10 – 2021-10-23 (×2): qty 30, 30d supply, fill #1
  Filled 2022-01-08: qty 30, 30d supply, fill #2

## 2021-01-19 ENCOUNTER — Other Ambulatory Visit: Payer: Self-pay

## 2021-01-25 ENCOUNTER — Other Ambulatory Visit: Payer: Self-pay

## 2021-01-27 ENCOUNTER — Ambulatory Visit: Payer: Medicaid Other | Admitting: Nurse Practitioner

## 2021-02-06 ENCOUNTER — Other Ambulatory Visit: Payer: Self-pay

## 2021-02-23 ENCOUNTER — Other Ambulatory Visit: Payer: Self-pay | Admitting: Nurse Practitioner

## 2021-02-23 ENCOUNTER — Telehealth: Payer: Self-pay

## 2021-02-23 DIAGNOSIS — I10 Essential (primary) hypertension: Secondary | ICD-10-CM

## 2021-02-23 NOTE — Telephone Encounter (Signed)
Amlodipine hydrochlorothiazide

## 2021-02-27 ENCOUNTER — Other Ambulatory Visit: Payer: Self-pay

## 2021-03-03 ENCOUNTER — Other Ambulatory Visit: Payer: Self-pay

## 2021-03-23 ENCOUNTER — Ambulatory Visit: Payer: Medicaid Other | Admitting: Nurse Practitioner

## 2021-04-03 ENCOUNTER — Other Ambulatory Visit: Payer: Self-pay

## 2021-04-07 ENCOUNTER — Other Ambulatory Visit: Payer: Self-pay

## 2021-05-05 ENCOUNTER — Other Ambulatory Visit: Payer: Self-pay

## 2021-05-08 ENCOUNTER — Other Ambulatory Visit: Payer: Self-pay

## 2021-05-22 ENCOUNTER — Ambulatory Visit (INDEPENDENT_AMBULATORY_CARE_PROVIDER_SITE_OTHER): Payer: Self-pay | Admitting: Nurse Practitioner

## 2021-05-22 ENCOUNTER — Other Ambulatory Visit: Payer: Self-pay

## 2021-05-22 DIAGNOSIS — I10 Essential (primary) hypertension: Secondary | ICD-10-CM

## 2021-05-23 NOTE — Progress Notes (Signed)
   [address], [place]  [address] Phone:  [phone]   Fax:  [phone]  No visit appointment to be rescheduled due to power

## 2021-06-07 ENCOUNTER — Other Ambulatory Visit: Payer: Self-pay

## 2021-06-08 ENCOUNTER — Other Ambulatory Visit: Payer: Self-pay

## 2021-06-21 ENCOUNTER — Other Ambulatory Visit: Payer: Self-pay

## 2021-06-23 ENCOUNTER — Other Ambulatory Visit: Payer: Self-pay

## 2021-06-26 ENCOUNTER — Other Ambulatory Visit: Payer: Self-pay

## 2021-06-28 ENCOUNTER — Ambulatory Visit: Payer: Medicaid Other | Admitting: Nurse Practitioner

## 2021-08-10 ENCOUNTER — Other Ambulatory Visit: Payer: Self-pay

## 2021-09-04 ENCOUNTER — Other Ambulatory Visit: Payer: Self-pay

## 2021-09-05 ENCOUNTER — Other Ambulatory Visit: Payer: Self-pay

## 2021-10-05 ENCOUNTER — Encounter (HOSPITAL_COMMUNITY): Payer: Self-pay

## 2021-10-05 ENCOUNTER — Ambulatory Visit (INDEPENDENT_AMBULATORY_CARE_PROVIDER_SITE_OTHER): Payer: Medicaid Other

## 2021-10-05 ENCOUNTER — Other Ambulatory Visit: Payer: Self-pay

## 2021-10-05 ENCOUNTER — Ambulatory Visit (HOSPITAL_COMMUNITY)
Admission: EM | Admit: 2021-10-05 | Discharge: 2021-10-05 | Disposition: A | Discharge status: Home / Self Care | Payer: Medicaid Other | Attending: Physician Assistant | Admitting: Physician Assistant

## 2021-10-05 DIAGNOSIS — R079 Chest pain, unspecified: Secondary | ICD-10-CM

## 2021-10-05 DIAGNOSIS — R0789 Other chest pain: Secondary | ICD-10-CM

## 2021-10-05 NOTE — Discharge Instructions (Signed)
I believe that you have muscle strain causing your pain since you are tender when we touch you.  I do think it is also reasonable to follow-up with a cardiologist so please call schedule an appointment.  If you develop any worsening symptoms including severe chest pain, lightheadedness, shortness of breath, nausea/vomiting you need to go to the emergency room immediately as we discussed.

## 2021-10-05 NOTE — ED Provider Notes (Signed)
[place]    CSN: [id number] Arrival date & time: 10/05/21  0820      History   Chief Complaint No chief complaint on file.   HPI Susan Mathis is a 51 y.o. female.   Patient presents today with a 2-week history of intermittent left-sided chest pain that is worsened in the past several days.  She reports pain is rated 10 on a 0-10 pain scale, localized to left anterior chest with radiation into axilla, described as sharp, worse with lifting movements, no alleviating factors identified.  She has tried [employer] with relief but not resolution of symptoms.  She denies any significant shortness of breath, diaphoresis, nausea, vomiting, lightheadedness.  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  She has a family history of cardiovascular disease in several second-degree relatives but denies heart attack or stroke in first-degree relatives.  She does have a history of diabetes and hypertension denies history of hyperlipidemia or smoking.  She does not report any association with activity.  Denies any recent illness.   Past Medical History:  Diagnosis Date   Fibroids    Hypertension    Migraines     Patient Active Problem List   Diagnosis Date Noted   Abnormal uterine bleeding (AUB) 07/01/2019   Cervical high risk HPV (human papillomavirus) test positive on 06/26/2019 06/26/2019   Essential hypertension 06/24/2016   Fibroid, uterine 02/27/2013   Hypokalemia 02/27/2013   Malignant hypertension 04/13/2012   Migraine headache 01/16/2011   Anemia 01/16/2011   Abnormal vaginal bleeding 01/16/2011    Past Surgical History:  Procedure Laterality Date   CESAREAN SECTION      OB History   No obstetric history on file.      Home Medications    Prior to Admission medications   Medication Sig Start Date End Date Taking? Authorizing Provider  amLODipine (NORVASC) 10 MG tablet Take 1 tablet (10  mg total) by mouth daily. 01/18/21   [name], NP  Aspirin-Acetaminophen-Caffeine (GOODY HEADACHE PO) Take by mouth.    [provider]  Blood Glucose Monitoring Suppl (TRUE METRIX METER) w/Device KIT 1 kit by Does not apply route in the morning, at noon, in the evening, and at bedtime. 12/22/20   [name], NP  Ferrous Fumarate (HEMOCYTE - 106 MG FE) 324 (106 Fe) MG TABS tablet Take 1 tablet (106 mg of iron total) by mouth daily. 03/08/20 06/06/20  [name], NP  glucose blood (TRUE METRIX BLOOD GLUCOSE TEST) test strip Use as instructed 12/22/20   [name], NP  hydrochlorothiazide (HYDRODIURIL) 25 MG tablet Take 1 tablet (25 mg total) by mouth daily. 01/18/21   [name], NP  ibuprofen (ADVIL) 800 MG tablet TAKE 1 TABLET BY MOUTH TWICE DAILY WITH MEALS AS NEEDED FOR PAIN Patient not taking: Reported on 12/22/2020 09/25/19   [name], NP  metFORMIN (GLUCOPHAGE) 500 MG tablet Take 2 tablets ($RemoveBe'1000mg'vGPvHGYQz$ ) by mouth twice daily. 12/22/20   [name], NP  TRUEplus Lancets 28G MISC use as directed in the morning, at noon, in the evening, and at bedtime. 12/22/20   [name], NP    Family History Family History  Problem Relation Age of Onset   Hypertension Mother    Hypertension Father     Social History Social History   Tobacco Use   Smoking status: Never   Smokeless tobacco: Never  Vaping Use   Vaping Use: Never used  Substance Use Topics   Alcohol use: Not Currently   Drug use: Never     Allergies   Patient has no known allergies.   Review of Systems Review of Systems  Constitutional:  Positive for activity change. Negative for appetite change, diaphoresis, fatigue and fever.  Respiratory:  Negative for cough and shortness of breath.   Cardiovascular:  Positive for chest pain.  Gastrointestinal:  Negative for abdominal pain, diarrhea, nausea and vomiting.  Neurological:  Negative for dizziness, weakness, light-headedness and headaches.     Physical Exam Triage Vital Signs ED Triage Vitals  Enc Vitals Group     BP 10/05/21 0949 133/85     Pulse Rate 10/05/21 0949 89     Resp 10/05/21 0949 18     Temp 10/05/21 0949 98.1 F (36.7 C)     Temp Source 10/05/21 0949 Oral     SpO2 10/05/21 0949 95 %     Weight --      Height --      Head Circumference --      Peak Flow --      Pain Score 10/05/21 0950 10     Pain Loc --      Pain Edu? --      Excl. in [place]? --    No data found.  Updated Vital Signs BP 133/85 (BP Location: Left Arm)    Pulse 89    Temp 98.1 F (36.7 C) (Oral)    Resp 18    SpO2 95%   Visual Acuity Right Eye Distance:   Left Eye Distance:   Bilateral Distance:    Right Eye Near:   Left Eye Near:    Bilateral Near:     Physical Exam Vitals reviewed.  Constitutional:      General: She is awake. She is not in acute distress.    Appearance: Normal appearance. She is well-developed. She is not ill-appearing.     Comments: Very pleasant female appears stated age no acute distress  HENT:     Head: Normocephalic and atraumatic.  Cardiovascular:     Rate and Rhythm: Normal rate and regular rhythm.     Heart sounds: Normal heart sounds, S1 normal and S2 normal. No murmur heard. Pulmonary:     Effort: Pulmonary effort is normal.     Breath sounds: Normal breath sounds. No wheezing, rhonchi or rales.     Comments: Clear to auscultation bilaterally Chest:     Chest wall: Tenderness present. No deformity or swelling.     Comments: Pain is reproducible exam.  Tenderness palpation over left anterior chest wall. Abdominal:     Palpations: Abdomen is soft.     Tenderness: There is no abdominal tenderness.  Psychiatric:        Behavior: Behavior is cooperative.     UC Treatments / Results  Labs (all labs ordered are listed, but only abnormal results are displayed) Labs Reviewed - No data to display  EKG   Radiology DG Chest 2 View  Result Date: 10/05/2021 CLINICAL DATA:  Chest pain EXAM:  CHEST - 2 VIEW COMPARISON:  None. FINDINGS: The heart size and mediastinal contours are within normal limits. Both lungs are clear. The visualized skeletal structures are unremarkable. IMPRESSION: No acute abnormality of the lungs. Electronically Signed   By: Delanna Ahmadi M.D.   On: 10/05/2021 10:34    Procedures Procedures (including critical care time)  Medications Ordered in  UC Medications - No data to display  Initial Impression / Assessment and Plan / UC Course  I have reviewed the triage vital signs and the nursing notes.  Pertinent labs & imaging results that were available during my care of the patient were reviewed by me and considered in my medical decision making (see chart for details).     EKG obtained showed normal sinus rhythm with ventricular rate of 84 bpm without ischemic changes; no significant changes compared to 09/25/2019 tracing.  Patient's pain is reproducible on exam and consistent with musculoskeletal etiology.  Recommended she use over-the-counter analgesics such as NSAIDs for symptom relief.  Chest x-ray was obtained that showed no acute abnormalities.  Discussed with patient that we are unable to rule out cardiac etiology but low suspicion given clinical presentation.  Recommended that she follow-up with cardiology given risk factors and was given contact information for local provider.  Discussed that if her symptoms change in any way and she develops persistent chest pain, nausea, vomiting, dizziness, lightheadedness, weakness, shortness of breath she must go to the emergency room immediately to which she expressed understanding.  Recommended follow-up with PCP as soon as possible.  Strict return precautions given to which she expressed understanding.  Final Clinical Impressions(s) / UC Diagnoses   Final diagnoses:  Chest wall pain     Discharge Instructions      I believe that you have muscle strain causing your pain since you are tender when we touch you.  I  do think it is also reasonable to follow-up with a cardiologist so please call schedule an appointment.  If you develop any worsening symptoms including severe chest pain, lightheadedness, shortness of breath, nausea/vomiting you need to go to the emergency room immediately as we discussed.     ED Prescriptions   None    PDMP not reviewed this encounter.   Terrilee Croak, PA-C 10/05/21 1048

## 2021-10-05 NOTE — ED Triage Notes (Signed)
Pt presents for chest pain and chest tightness on her left side that started two weeks ago.  Pt reports she does heavy lifting doing the course of the day/

## 2021-10-11 ENCOUNTER — Other Ambulatory Visit: Payer: Self-pay

## 2021-10-17 ENCOUNTER — Other Ambulatory Visit: Payer: Self-pay

## 2021-10-20 ENCOUNTER — Ambulatory Visit (INDEPENDENT_AMBULATORY_CARE_PROVIDER_SITE_OTHER): Payer: Self-pay | Admitting: Cardiology

## 2021-10-20 ENCOUNTER — Other Ambulatory Visit: Payer: Self-pay

## 2021-10-20 ENCOUNTER — Encounter: Payer: Self-pay | Admitting: Cardiology

## 2021-10-20 VITALS — BP 140/90 | HR 94 | Ht 65.0 in | Wt 217.4 lb

## 2021-10-20 DIAGNOSIS — R9431 Abnormal electrocardiogram [ECG] [EKG]: Secondary | ICD-10-CM

## 2021-10-20 DIAGNOSIS — E1169 Type 2 diabetes mellitus with other specified complication: Secondary | ICD-10-CM

## 2021-10-20 DIAGNOSIS — M940 Chondrocostal junction syndrome [Tietze]: Secondary | ICD-10-CM

## 2021-10-20 DIAGNOSIS — I10 Essential (primary) hypertension: Secondary | ICD-10-CM

## 2021-10-20 DIAGNOSIS — E785 Hyperlipidemia, unspecified: Secondary | ICD-10-CM

## 2021-10-20 MED ORDER — PREDNISONE 10 MG PO TABS
ORAL_TABLET | ORAL | 0 refills | Status: AC
  Filled 2021-10-20: qty 29, 10d supply, fill #0

## 2021-10-20 NOTE — Progress Notes (Addendum)
Primary Care Provider: [name], NP [facility] Cardiologist: None Electrophysiologist: None  Clinic Note: Chief Complaint  Patient presents with   New Patient (Initial Visit)    Chest pain   ===================================  ASSESSMENT/PLAN   Problem List Items Addressed This Visit       Cardiology Problems   Essential hypertension - Primary (Chronic)    Blood pressure is little high today, but she is in significant mount of pain.  She is only on HCTZ along with amlodipine.-May potentially convert to chlorthalidone from HCTZ which allows her to go to 50 if necessary.  (However potassium is low-can consider spironolactone).  Reassess in follow-up      Relevant Orders   Lipid panel   Comprehensive metabolic panel   Hemoglobin A1c   CT CARDIAC SCORING (SELF PAY ONLY)   Hyperlipidemia associated with type 2 diabetes mellitus (HCC) (Chronic)    LDL was 127 with total cholesterol 203 and patient with diabetes.  Probably want to see the LDL down below 100 to reduce cardiovascular risk  She would like to get a baseline risk assessment.  Since the symptoms she having is not overly concerning for CAD, I think we can stop the Coronary Calcium Score for screening.   Recheck lipids A1c and CMP for follow-up-has not had labs checked since May of last year.      Relevant Orders   Lipid panel   Comprehensive metabolic panel   Hemoglobin A1c   CT CARDIAC SCORING (SELF PAY ONLY)     Other   Costochondritis, acute    Now somewhat sustained course of what seems like classic costochondritis symptoms.  I am somewhat concerned about her history of uterine bleeding from fibroids, therefore would prefer not to use ibuprofen taper.  Plan: Steroid taper. Take with food /breakfast  60 mg  ( 6 tablets of 10 mg )  for 2 days , 40 mg  ( 4 tablets of 10 mg ) for 2 days 20 mg  ( 2 tablets of 10 mg ) for 3 days 10 mg ( 1 tablet of 10 mg for 3 days  then stop.       Relevant  Orders   EKG 12-Lead   Lipid panel   Comprehensive metabolic panel   Hemoglobin A1c   CT CARDIAC SCORING (SELF PAY ONLY)   Nonspecific abnormal electrocardiogram (ECG) (EKG)    Nonspecific findings on EKG make it difficult to interpret a treadmill.  There is suggestion of possible ischemia although her symptoms are nonischemic in nature.  For now we will treat possible costochondritis and then potentially consider further testing based on symptoms and follow-up.      Relevant Orders   EKG 12-Lead   Lipid panel   Comprehensive metabolic panel   Hemoglobin A1c   CT CARDIAC SCORING (SELF PAY ONLY)   Other Visit Diagnoses     Abnormal electrocardiogram       Relevant Orders   EKG 12-Lead   Lipid panel   Comprehensive metabolic panel   Hemoglobin A1c   CT CARDIAC SCORING (SELF PAY ONLY)       ===================================  HPI:    Susan Mathis is an obese 51 y.o. female with PMH notable for HTN, and DM-2 is being seen today for the evaluation of LEFT-SIDED CHEST PAIN at the request of [name], NP.  Susan Mathis was last seen by her PCP on Dec 22, 2020 => noted that she is relatively sedentary.  Not  exercising.  Trying to stay adherent to low-salt diet.  20 pound weight gain  Recent Hospitalizations:  10/05/2021: [facility]: 2-week history of intermittent chest pain.  10/10.  Anterior chest radiating to axilla.  Sharp.  Worse with lifting movements no relief with Goody powder.  No associated dyspnea or diaphoresis.  Family history of CAD (several secondary relatives).  Mother history of stroke (primary caregiver) => normal EKG.  Reproducible pain on exam.  Skeletal pain and recommended analgesics.  However based on risk factors recommended cardiology evaluation.  Reviewed  CV studies:    The following studies were reviewed today: (if available, images/films reviewed: From Epic Chart or Care Everywhere) None:   Interval History:   Susan Mathis  presents for evaluation of her chest pain.  She is still quite sore.  Worse with laying down, worse with movements.  The pain is across her chest but mostly on the left lateral chest.  It is persistent and not made worse with walking besides when she swings her arms.  She says that taking ibuprofen every and has not helped.  It does hurt to take a deep breath and therefore she does get little short of breath with exertion.  With pain has been ongoing now for about 3 weeks.  She is not able to get any sleep and is getting frustrated.  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  She pretty much is a full care and therefore Susan Mathis is always having to pick her up and move her.  This requires lots of packed shoulders.  She has chronic back and hip pain as well as shoulder and arm/chest pain.  She denies any PND, orthopnea with trivial edema.  No tachycardia symptoms of rapid irregular beats/palpitations.  No syncope or near syncope.  No TIA or amaurosis fugax.  No claudication.  Her main concern with having chest pain is that her cousin recently had an MI in his 30s.  REVIEWED OF SYSTEMS   Review of Systems  Constitutional:  Positive for malaise/fatigue (Exhausted at the end of the day) and weight loss (Significant weight loss in the last year, still has back pain.).  HENT:  Negative for congestion and nosebleeds.   Respiratory:  Positive for shortness of breath (She gets short of breath with exertion mostly because it hurts her back to exert herself and that takes her breath away.). Negative for cough.   Cardiovascular:  Positive for chest pain, palpitations (Occasional-very rare) and leg swelling (But not pedal edema, mostly joints). Negative for claudication.  Gastrointestinal:  Negative for blood in stool, constipation and melena.  Genitourinary:  Negative for flank pain.  Musculoskeletal:  Positive for joint pain (Mild arthritis  pains.). Negative for myalgias.  Neurological:  Positive for headaches. Negative for tremors and focal weakness.  Psychiatric/Behavioral:  Negative for depression. The patient is nervous/anxious and has insomnia.    I have reviewed and (if needed) personally updated the patient's problem list, medications, allergies, past medical and surgical history, social and family history.   PAST MEDICAL HISTORY   Past Medical History:  Diagnosis Date   Fibroids    Hypertension    Migraines     PAST SURGICAL HISTORY   Past Surgical History:  Procedure Laterality Date   CESAREAN SECTION      Immunization History  Administered Date(s) Administered   Tdap 05/21/2019    MEDICATIONS/ALLERGIES   Current Meds  Medication Sig   amLODipine (NORVASC) 10 MG tablet Take 1 tablet (10 mg total) by mouth daily.   Aspirin-Acetaminophen-Caffeine (GOODY HEADACHE PO) Take by mouth.   Blood Glucose Monitoring Suppl (TRUE METRIX METER) w/Device KIT 1 kit by Does not apply route in the morning, at noon, in the evening, and at bedtime.   glucose blood (TRUE METRIX BLOOD GLUCOSE TEST) test strip Use as instructed   hydrochlorothiazide (HYDRODIURIL) 25 MG tablet Take 1 tablet (25 mg total) by mouth daily.   ibuprofen (ADVIL) 800 MG tablet TAKE 1 TABLET BY MOUTH TWICE DAILY WITH MEALS AS NEEDED FOR PAIN   metFORMIN (GLUCOPHAGE) 500 MG tablet Take 2 tablets ($RemoveBe'1000mg'ZqAkiccsv$ ) by mouth twice daily.   TRUEplus Lancets 28G MISC use as directed in the morning, at noon, in the evening, and at bedtime.    No Known Allergies  SOCIAL HISTORY/FAMILY HISTORY   Reviewed in Epic:   Social History   Tobacco Use   Smoking status: Never   Smokeless tobacco: Never  Vaping Use   Vaping Use: Never used  Substance Use Topics   Alcohol use: Not Currently   Drug use: Never   Social History   Social History Narrative   Not on file   Family History  Problem Relation Age of Onset   Hypertension Mother    Hypertension  Father     OBJCTIVE -PE, EKG, labs   Wt Readings from Last 3 Encounters:  10/20/21 217 lb 6.4 oz (98.6 kg)  12/22/20 243 lb (110.2 kg)  06/24/20 224 lb 3.2 oz (101.7 kg)    Physical Exam: BP 140/90 (BP Location: Left Arm, Patient Position: Sitting)    Pulse 94    Ht $R'5\' 5"'uW$  (1.651 m)    Wt 217 lb 6.4 oz (98.6 kg)    SpO2 95%    BMI 36.18 kg/m  Physical Exam Vitals reviewed.  Constitutional:      General: She is in acute distress (Seems to be in some distress because of her ongoing chest pain.  Hurting her right now.).     Appearance: Normal appearance. She is obese. She is not ill-appearing (Well-nourished, well-groomed.  Otherwise healthy-appearing.) or toxic-appearing.  HENT:     Head: Normocephalic and atraumatic.  Neck:     Vascular: No carotid bruit or JVD.  Cardiovascular:     Rate and Rhythm: Normal rate and regular rhythm. No extrasystoles are present.    Chest Wall: PMI is not displaced.     Pulses: Normal pulses and intact distal pulses.     Heart sounds: S1 normal and S2 normal. Heart sounds are distant. No murmur heard.   No friction rub. No gallop.  Pulmonary:     Effort: Pulmonary effort is normal.     Breath sounds: No stridor. No wheezing, rhonchi or rales.  Chest:     Chest wall: No tenderness (Exquisite tenderness along the third and fourth ribs in the mid rib from both Compass chondral borders.  Is also some discomfort in the reproducibility on the right side.  This is clearly symptoms she is having and is exacerbated by certain arm movements.).  Abdominal:     General: Abdomen is flat. Bowel sounds are normal. There is no distension.     Palpations: Abdomen is soft. There is no mass.     Tenderness: There is no abdominal tenderness.     Comments: No HSM or bruit  Musculoskeletal:        General: No swelling. Normal range  of motion.     Cervical back: Normal range of motion and neck supple.  Skin:    General: Skin is warm and dry.  Neurological:      General: No focal deficit present.     Mental Status: She is alert and oriented to person, place, and time.  Psychiatric:        Mood and Affect: Mood normal.        Thought Content: Thought content normal.        Judgment: Judgment normal.     Comments: [place] is upset upon initial evaluation.  Cleared up at the end of the visit.     Adult ECG Report  Rate: 94 ;  Rhythm: normal sinus rhythm and persistent ST and T wave abnormalities with T wave inversion in inferolateral leads suggestive of possible ischemia. ;   Narrative Interpretation: Stable  Recent Labs: Reviewed Lab Results  Component Value Date   CHOL 203 (H) 12/22/2020   HDL 51 12/22/2020   LDLCALC 127 (H) 12/22/2020   TRIG 141 12/22/2020   CHOLHDL 4.0 12/22/2020   Lab Results  Component Value Date   CREATININE 0.54 (L) 12/22/2020   BUN 15 12/22/2020   NA 134 12/22/2020   K 3.6 12/22/2020   CL 94 (L) 12/22/2020   CO2 26 03/12/2020   CBC Latest Ref Rng & Units 03/24/2020 03/12/2020 10/02/2019  WBC 3.4 - 10.8 x10E3/uL 7.4 6.1 -  Hemoglobin 11.1 - 15.9 g/dL 13.8 12.9 8.1(L)  Hematocrit 34.0 - 46.6 % 42.8 40.6 29.4(L)  Platelets 150 - 450 x10E3/uL 286 256 -    Lab Results  Component Value Date   HGBA1C 8.3 12/23/2020   Lab Results  Component Value Date   TSH 1.600 03/24/2020    ==================================================  COVID-19 Education: The signs and symptoms of COVID-19 were discussed with the patient and how to seek care for testing (follow up with PCP or arrange E-visit).    I spent a total of 22 minutes with the patient spent in direct patient consultation.  Additional time spent with chart review  / charting (studies, outside notes, etc): 24 min Total Time: 46 min  Current medicines are reviewed at length with the patient today.  (+/- concerns) n/a  This visit occurred during the SARS-CoV-2 public health emergency.  Safety protocols were in place, including screening questions prior to the  visit, additional usage of staff PPE, and extensive cleaning of exam room while observing appropriate contact time as indicated for disinfecting solutions.  Notice: This dictation was prepared with Dragon dictation along with smart phrase technology. Any transcriptional errors that result from this process are unintentional and may not be corrected upon review.   Studies Ordered:  Orders Placed This Encounter  Procedures   CT CARDIAC SCORING (SELF PAY ONLY)   Lipid panel   Comprehensive metabolic panel   Hemoglobin A1c   EKG 12-Lead    Patient Instructions / Medication Changes & Studies & Tests Ordered   Patient Instructions  Medication Instructions:     Start a taper dose of Prednisone for chest discomfort  Take with food /breakfast  60 mg  ( 6 tablets of 10 mg )  for 2 days , 40 mg  ( 4 tablets of 10 mg ) for 2 days 20 mg  ( 2 tablets of 10 mg ) for 3 days 10 mg ( 1 tablet of 10 mg for 3 days  then stop.   *If you need a refill on  your cardiac medications before your next appointment, please call your pharmacy*   Lab Work: fasting   please do before your next visit  CMP LIPID HGBA1c  If you have labs (blood work) drawn today and your tests are completely normal, you will receive your results only by: [place] (if you have MyChart) OR A paper copy in the mail If you have any lab test that is abnormal or we need to change your treatment, we will call you to review the results.   Testing/Procedures:   CT coronary calcium score.   Test locations:  [facility] ([address] [place], [place] [address]) [facility] ([address] [place], [place] [address])   This is $99 out of pocket.    Your next appointment:   2 to 3 month(s)  The format for your next appointment:   In Person  Provider:   None    Other Instructions       Glenetta Hew, M.D., M.S. Interventional Cardiologist   Pager # [phone] Phone #  [phone] [address] [place], [place] [address]   Thank you for choosing Heart[facility] at [facility]!!

## 2021-10-20 NOTE — Patient Instructions (Addendum)
Medication Instructions:  ? ?  Start a taper dose of Prednisone for chest discomfort ? ?Take with food /breakfast  60 mg  ( 6 tablets of 10 mg )  for 2 days , ?40 mg  ( 4 tablets of 10 mg ) for 2 days ?20 mg  ( 2 tablets of 10 mg ) for 3 days ?10 mg ( 1 tablet of 10 mg for 3 days  then stop.  ? ?*If you need a refill on your cardiac medications before your next appointment, please call your pharmacy* ? ? ?Lab Work: fasting   please do before your next visit  ?CMP ?LIPID ?HGBA1c  ?If you have labs (blood work) drawn today and your tests are completely normal, you will receive your results only by: ?MyChart Message (if you have MyChart) OR ?A paper copy in the mail ?If you have any lab test that is abnormal or we need to change your treatment, we will call you to review the results. ? ? ?Testing/Procedures: ? ? CT coronary calcium score.  ? ?Test locations:  ?[facility] ([address], [place] [address]) ?[facility]  ([address] [place], [place] [address])  ? ?This is $99 out of pocket. ? ? ?Coronary CalciumScan ?A coronary calcium scan is an imaging test used to look for deposits of calcium and other fatty materials (plaques) in the inner lining of the blood vessels of the heart (coronary arteries). These deposits of calcium and plaques can partly clog and narrow the coronary arteries without producing any symptoms or warning signs. This puts a person at risk for a heart attack. This test can detect these deposits before symptoms develop. ?Tell a health care provider about: ?Any allergies you have. ?All medicines you are taking, including vitamins, herbs, eye drops, creams, and over-the-counter medicines. ?Any problems you or family members have had with anesthetic medicines. ?Any blood disorders you have. ?Any surgeries you have had. ?Any medical conditions you have. ?Whether you are pregnant or may be pregnant. ?What are the risks? ?Generally, this is a safe procedure. However,  problems may occur, including: ?Harm to a pregnant woman and her unborn baby. This test involves the use of radiation. Radiation exposure can be dangerous to a pregnant woman and her unborn baby. If you are pregnant, you generally should not have this procedure done. ?Slight increase in the risk of cancer. This is because of the radiation involved in the test. ?What happens before the procedure? ?No preparation is needed for this procedure. ?What happens during the procedure? ?You will undress and remove any jewelry around your neck or chest. ?You will put on a hospital gown. ?Sticky electrodes will be placed on your chest. The electrodes will be connected to an electrocardiogram (ECG) machine to record a tracing of the electrical activity of your heart. ?A CT scanner will take pictures of your heart. During this time, you will be asked to lie still and hold your breath for 2-3 seconds while a picture of your heart is being taken. ?The procedure may vary among health care providers and hospitals. ?What happens after the procedure? ?You can get dressed. ?You can return to your normal activities. ?It is up to you to get the results of your test. Ask your health care provider, or the department that is doing the test, when your results will be ready. ?Summary ?A coronary calcium scan is an imaging test used to look for deposits of calcium and other fatty materials (plaques) in the  inner lining of the blood vessels of the heart (coronary arteries). ?Generally, this is a safe procedure. Tell your health care provider if you are pregnant or may be pregnant. ?No preparation is needed for this procedure. ?A CT scanner will take pictures of your heart. ?You can return to your normal activities after the scan is done. ?This information is not intended to replace advice given to you by your health care provider. Make sure you discuss any questions you have with your health care provider. ?Document Released: 02/02/2008 Document  Revised: 06/25/2016 Document Reviewed: 06/25/2016 ?Elsevier Interactive Patient Education ? 2017 [place].  ? ?Follow-Up: ?At [facility], you and your health needs are our priority.  As part of our continuing mission to provide you with exceptional heart care, we have created designated Provider Care Teams.  These Care Teams include your primary Cardiologist (physician) and Advanced Practice Providers (APPs -  Physician Assistants and Nurse Practitioners) who all work together to provide you with the care you need, when you need it. ? ?  ? ?Your next appointment:   ?2 to 3 month(s) ? ?The format for your next appointment:   ?In Person ? ?Provider:   ?None  ? ? ?Other Instructions  ? ? ?

## 2021-10-21 ENCOUNTER — Encounter: Payer: Self-pay | Admitting: Cardiology

## 2021-10-21 NOTE — Assessment & Plan Note (Addendum)
Now somewhat sustained course of what seems like classic costochondritis symptoms.  I am somewhat concerned about her history of uterine bleeding from fibroids, therefore would prefer not to use ibuprofen taper. ? ?Plan: Steroid taper. ?Take with food /breakfast  60 mg  ( 6 tablets of 10 mg )  for 2 days , ?40 mg  ( 4 tablets of 10 mg ) for 2 days ?20 mg  ( 2 tablets of 10 mg ) for 3 days ?10 mg ( 1 tablet of 10 mg for 3 days  then stop.  ?

## 2021-10-21 NOTE — Assessment & Plan Note (Signed)
Blood pressure is little high today, but she is in significant mount of pain.  She is only on HCTZ along with amlodipine.-May potentially convert to chlorthalidone from HCTZ which allows her to go to 50 if necessary.  (However potassium is low-can consider spironolactone). ? ?Reassess in follow-up ?

## 2021-10-21 NOTE — Assessment & Plan Note (Addendum)
LDL was 127 with total cholesterol 203 and patient with diabetes.  Probably want to see the LDL down below 100 to reduce cardiovascular risk ? ?She would like to get a baseline risk assessment.  Since the symptoms she having is not overly concerning for CAD, I think we can stop the Coronary Calcium Score for screening. ? ? ?Recheck lipids A1c and CMP for follow-up-has not had labs checked since May of last year. ?

## 2021-10-21 NOTE — Assessment & Plan Note (Signed)
Nonspecific findings on EKG make it difficult to interpret a treadmill.  There is suggestion of possible ischemia although her symptoms are nonischemic in nature. ? ?For now we will treat possible costochondritis and then potentially consider further testing based on symptoms and follow-up. ?

## 2021-10-23 ENCOUNTER — Other Ambulatory Visit: Payer: Self-pay

## 2021-10-26 ENCOUNTER — Other Ambulatory Visit: Payer: Self-pay

## 2021-11-03 ENCOUNTER — Other Ambulatory Visit: Payer: Self-pay

## 2021-11-09 ENCOUNTER — Other Ambulatory Visit: Payer: Self-pay

## 2021-11-11 LAB — COMPREHENSIVE METABOLIC PANEL
ALT: 20 IU/L (ref 0–32)
AST: 14 IU/L (ref 0–40)
Albumin/Globulin Ratio: 1.9 (ref 1.2–2.2)
Albumin: 4.5 g/dL (ref 3.8–4.9)
Alkaline Phosphatase: 66 IU/L (ref 44–121)
BUN/Creatinine Ratio: 18 (ref 9–23)
BUN: 14 mg/dL (ref 6–24)
Bilirubin Total: 0.2 mg/dL (ref 0.0–1.2)
CO2: 21 mmol/L (ref 20–29)
Calcium: 9.8 mg/dL (ref 8.7–10.2)
Chloride: 97 mmol/L (ref 96–106)
Creatinine, Ser: 0.77 mg/dL (ref 0.57–1.00)
Globulin, Total: 2.4 g/dL (ref 1.5–4.5)
Glucose: 197 mg/dL — ABNORMAL HIGH (ref 70–99)
Potassium: 3.8 mmol/L (ref 3.5–5.2)
Sodium: 137 mmol/L (ref 134–144)
Total Protein: 6.9 g/dL (ref 6.0–8.5)
eGFR: 93 mL/min/{1.73_m2} (ref >59)

## 2021-11-11 LAB — HEMOGLOBIN A1C
Est. average glucose Bld gHb Est-mCnc: 177 mg/dL
Hgb A1c MFr Bld: 7.8 % — ABNORMAL HIGH (ref 4.8–5.6)

## 2021-11-11 LAB — LIPID PANEL
Chol/HDL Ratio: 4.6 ratio — ABNORMAL HIGH (ref 0.0–4.4)
Cholesterol, Total: 223 mg/dL — ABNORMAL HIGH (ref 100–199)
HDL: 49 mg/dL (ref >39)
LDL Chol Calc (NIH): 149 mg/dL — ABNORMAL HIGH (ref 0–99)
Triglycerides: 139 mg/dL (ref 0–149)
VLDL Cholesterol Cal: 25 mg/dL (ref 5–40)

## 2021-12-05 ENCOUNTER — Inpatient Hospital Stay: Admission: RE | Admit: 2021-12-05 | Payer: Medicaid Other | Source: Ambulatory Visit

## 2022-01-01 ENCOUNTER — Other Ambulatory Visit: Payer: Self-pay | Admitting: Nurse Practitioner

## 2022-01-02 ENCOUNTER — Other Ambulatory Visit: Payer: Self-pay

## 2022-01-03 ENCOUNTER — Other Ambulatory Visit: Payer: Self-pay

## 2022-01-03 MED ORDER — METFORMIN HCL 500 MG PO TABS
500.0000 mg | ORAL_TABLET | Freq: Two times a day (BID) | ORAL | 0 refills | Status: DC
  Filled 2022-01-03: qty 60, 30d supply, fill #0

## 2022-01-05 ENCOUNTER — Other Ambulatory Visit: Payer: Self-pay

## 2022-01-08 ENCOUNTER — Other Ambulatory Visit: Payer: Self-pay

## 2022-01-28 ENCOUNTER — Other Ambulatory Visit: Payer: Self-pay | Admitting: Nurse Practitioner

## 2022-01-28 DIAGNOSIS — I10 Essential (primary) hypertension: Secondary | ICD-10-CM

## 2022-01-31 ENCOUNTER — Other Ambulatory Visit: Payer: Self-pay

## 2022-02-01 ENCOUNTER — Other Ambulatory Visit: Payer: Self-pay

## 2022-02-01 ENCOUNTER — Other Ambulatory Visit: Payer: Self-pay | Admitting: Nurse Practitioner

## 2022-02-01 ENCOUNTER — Telehealth: Payer: Self-pay

## 2022-02-01 DIAGNOSIS — I10 Essential (primary) hypertension: Secondary | ICD-10-CM

## 2022-02-01 MED ORDER — HYDROCHLOROTHIAZIDE 25 MG PO TABS
25.0000 mg | ORAL_TABLET | Freq: Every day | ORAL | 3 refills | Status: DC
  Filled 2022-02-01: qty 30, 30d supply, fill #0

## 2022-02-01 MED ORDER — AMLODIPINE BESYLATE 10 MG PO TABS
10.0000 mg | ORAL_TABLET | Freq: Every day | ORAL | 3 refills | Status: DC
  Filled 2022-02-01: qty 30, 30d supply, fill #0

## 2022-02-01 NOTE — Telephone Encounter (Signed)
Amlodipine Hydrochlorothiazide metformin

## 2022-02-02 ENCOUNTER — Other Ambulatory Visit: Payer: Self-pay

## 2022-02-05 ENCOUNTER — Other Ambulatory Visit: Payer: Self-pay

## 2022-02-05 NOTE — Telephone Encounter (Signed)
Patient has not been seen in 6 months and will need to be seen in the office before refills are given.

## 2022-02-22 ENCOUNTER — Ambulatory Visit (INDEPENDENT_AMBULATORY_CARE_PROVIDER_SITE_OTHER): Payer: Self-pay | Admitting: Nurse Practitioner

## 2022-02-22 ENCOUNTER — Other Ambulatory Visit: Payer: Self-pay

## 2022-02-22 ENCOUNTER — Encounter: Payer: Self-pay | Admitting: Nurse Practitioner

## 2022-02-22 DIAGNOSIS — I10 Essential (primary) hypertension: Secondary | ICD-10-CM

## 2022-02-22 MED ORDER — HYDROCHLOROTHIAZIDE 25 MG PO TABS
25.0000 mg | ORAL_TABLET | Freq: Every day | ORAL | 3 refills | Status: DC
  Filled 2022-02-22 – 2022-03-01 (×2): qty 90, 90d supply, fill #0
  Filled 2022-03-04: qty 30, 30d supply, fill #0
  Filled 2022-04-04: qty 30, 30d supply, fill #1
  Filled 2022-05-07: qty 30, 30d supply, fill #2
  Filled 2022-06-16: qty 30, 30d supply, fill #3
  Filled 2022-07-18: qty 30, 30d supply, fill #4
  Filled 2022-08-21: qty 30, 30d supply, fill #5
  Filled 2022-09-18: qty 30, 30d supply, fill #6
  Filled 2022-10-16: qty 30, 30d supply, fill #7
  Filled 2022-11-13: qty 30, 30d supply, fill #8
  Filled 2022-12-14: qty 30, 30d supply, fill #9
  Filled 2023-01-14: qty 30, 30d supply, fill #10
  Filled 2023-02-12 – 2023-02-22 (×2): qty 30, 30d supply, fill #11

## 2022-02-22 MED ORDER — METFORMIN HCL 500 MG PO TABS
500.0000 mg | ORAL_TABLET | Freq: Two times a day (BID) | ORAL | 2 refills | Status: DC
  Filled 2022-02-22 – 2022-03-01 (×2): qty 60, 30d supply, fill #0
  Filled 2022-03-04: qty 60, 15d supply, fill #0
  Filled 2022-04-04: qty 60, 15d supply, fill #1
  Filled 2022-05-07: qty 60, 15d supply, fill #2

## 2022-02-22 MED ORDER — AMLODIPINE BESYLATE 10 MG PO TABS
10.0000 mg | ORAL_TABLET | Freq: Every day | ORAL | 3 refills | Status: DC
  Filled 2022-02-22 – 2022-03-01 (×2): qty 90, 90d supply, fill #0
  Filled 2022-03-04: qty 30, 30d supply, fill #0
  Filled 2022-04-04: qty 30, 30d supply, fill #1
  Filled 2022-05-07: qty 30, 30d supply, fill #2
  Filled 2022-06-16: qty 30, 30d supply, fill #3
  Filled 2022-07-18: qty 30, 30d supply, fill #4
  Filled 2022-08-21: qty 30, 30d supply, fill #5
  Filled 2022-09-18: qty 30, 30d supply, fill #6
  Filled 2022-10-16: qty 30, 30d supply, fill #7
  Filled 2022-11-13: qty 30, 30d supply, fill #8
  Filled 2022-12-14: qty 30, 30d supply, fill #9
  Filled 2023-01-14: qty 30, 30d supply, fill #10
  Filled 2023-02-12: qty 30, 30d supply, fill #11

## 2022-02-22 NOTE — Patient Instructions (Signed)
1. Hypertension, unspecified type  - metFORMIN (GLUCOPHAGE) 500 MG tablet; Take 2 tablets ('1000mg'$ ) by mouth twice daily.  Dispense: 60 tablet; Refill: 2 - hydrochlorothiazide (HYDRODIURIL) 25 MG tablet; Take 1 tablet (25 mg total) by mouth once daily.  Dispense: 90 tablet; Refill: 3 - amLODipine (NORVASC) 10 MG tablet; Take 1 tablet (10 mg total) by mouth once daily.  Dispense: 90 tablet; Refill: 3  Follow up:  Follow up in 6 months - will need labs at next visit.

## 2022-02-22 NOTE — Progress Notes (Signed)
Virtual Visit via Telephone Note  I connected with Susan Mathis on 02/22/22 at 10:20 AM EDT by telephone and verified that I am speaking with the correct person using two identifiers.  Location: Patient: home Provider: office   I discussed the limitations, risks, security and privacy concerns of performing an evaluation and management service by telephone and the availability of in person appointments. I also discussed with the patient that there may be a patient responsible charge related to this service. The patient expressed understanding and agreed to proceed.   History of Present Illness:  Susan Mathis presents for follow up. She  has a past medical history of Fibroids, Hypertension, and Migraines.   Patient presents today for follow-up for hypertension through telephone visit. Patient does follow a low-salt diet but does not exercise regularly. Cardiovascular risk factors: hypertension, obesity (BMI >= 30 kg/m2), and sedentary lifestyle. Use of agents associated with hypertension: NSAIDS. History of target organ damage: none. Denies f/c/s, n/v/d, hemoptysis, PND, leg swelling Denies chest pain or edema     Observations/Objective:     02/22/2022   10:06 AM 10/20/2021    8:28 AM 10/05/2021    9:49 AM  Vitals with BMI  Height  '5\' 5"'$    Weight 218 lbs 217 lbs 6 oz   BMI  68.11   Systolic  572 620  Diastolic  90 85  Pulse  94 89      Assessment and Plan:  1. Hypertension, unspecified type  - metFORMIN (GLUCOPHAGE) 500 MG tablet; Take 2 tablets ('1000mg'$ ) by mouth twice daily.  Dispense: 60 tablet; Refill: 2 - hydrochlorothiazide (HYDRODIURIL) 25 MG tablet; Take 1 tablet (25 mg total) by mouth once daily.  Dispense: 90 tablet; Refill: 3 - amLODipine (NORVASC) 10 MG tablet; Take 1 tablet (10 mg total) by mouth once daily.  Dispense: 90 tablet; Refill: 3  Follow up:  Follow up in 6 months - will need labs at next visit.    I discussed the assessment and treatment plan  with the patient. The patient was provided an opportunity to ask questions and all were answered. The patient agreed with the plan and demonstrated an understanding of the instructions.   The patient was advised to call back or seek an in-person evaluation if the symptoms worsen or if the condition fails to improve as anticipated.  I provided 23 minutes of non-face-to-face time during this encounter.   Fenton Foy, NP

## 2022-02-23 ENCOUNTER — Ambulatory Visit: Payer: Medicaid Other | Admitting: Cardiology

## 2022-02-28 ENCOUNTER — Other Ambulatory Visit: Payer: Self-pay

## 2022-03-01 ENCOUNTER — Other Ambulatory Visit (HOSPITAL_COMMUNITY): Payer: Self-pay

## 2022-03-02 ENCOUNTER — Encounter: Payer: Self-pay | Admitting: Cardiology

## 2022-03-05 ENCOUNTER — Other Ambulatory Visit: Payer: Self-pay

## 2022-03-06 ENCOUNTER — Other Ambulatory Visit: Payer: Self-pay

## 2022-04-04 ENCOUNTER — Other Ambulatory Visit: Payer: Self-pay

## 2022-04-06 ENCOUNTER — Other Ambulatory Visit: Payer: Self-pay

## 2022-05-07 ENCOUNTER — Other Ambulatory Visit: Payer: Self-pay

## 2022-05-10 ENCOUNTER — Other Ambulatory Visit: Payer: Self-pay

## 2022-06-18 ENCOUNTER — Other Ambulatory Visit: Payer: Self-pay

## 2022-06-20 ENCOUNTER — Other Ambulatory Visit: Payer: Self-pay

## 2022-06-21 ENCOUNTER — Other Ambulatory Visit: Payer: Self-pay

## 2022-07-11 ENCOUNTER — Ambulatory Visit: Payer: Self-pay | Admitting: Nurse Practitioner

## 2022-07-18 ENCOUNTER — Other Ambulatory Visit: Payer: Self-pay | Admitting: Nurse Practitioner

## 2022-07-18 DIAGNOSIS — I10 Essential (primary) hypertension: Secondary | ICD-10-CM

## 2022-07-19 ENCOUNTER — Other Ambulatory Visit: Payer: Self-pay

## 2022-07-24 ENCOUNTER — Ambulatory Visit (INDEPENDENT_AMBULATORY_CARE_PROVIDER_SITE_OTHER): Payer: Medicaid Other | Admitting: Nurse Practitioner

## 2022-07-24 ENCOUNTER — Encounter: Payer: Self-pay | Admitting: Nurse Practitioner

## 2022-07-24 ENCOUNTER — Other Ambulatory Visit: Payer: Self-pay

## 2022-07-24 VITALS — BP 145/101 | HR 72 | Temp 98.3°F | Ht 65.0 in | Wt 215.0 lb

## 2022-07-24 DIAGNOSIS — E785 Hyperlipidemia, unspecified: Secondary | ICD-10-CM | POA: Diagnosis not present

## 2022-07-24 DIAGNOSIS — E1165 Type 2 diabetes mellitus with hyperglycemia: Secondary | ICD-10-CM

## 2022-07-24 DIAGNOSIS — M940 Chondrocostal junction syndrome [Tietze]: Secondary | ICD-10-CM

## 2022-07-24 DIAGNOSIS — Z6835 Body mass index (BMI) 35.0-35.9, adult: Secondary | ICD-10-CM

## 2022-07-24 DIAGNOSIS — E1169 Type 2 diabetes mellitus with other specified complication: Secondary | ICD-10-CM

## 2022-07-24 DIAGNOSIS — Z1211 Encounter for screening for malignant neoplasm of colon: Secondary | ICD-10-CM

## 2022-07-24 DIAGNOSIS — Z1231 Encounter for screening mammogram for malignant neoplasm of breast: Secondary | ICD-10-CM | POA: Diagnosis not present

## 2022-07-24 DIAGNOSIS — E66812 Obesity, class 2: Secondary | ICD-10-CM

## 2022-07-24 DIAGNOSIS — E669 Obesity, unspecified: Secondary | ICD-10-CM | POA: Insufficient documentation

## 2022-07-24 DIAGNOSIS — I10 Essential (primary) hypertension: Secondary | ICD-10-CM

## 2022-07-24 LAB — POCT GLYCOSYLATED HEMOGLOBIN (HGB A1C): Hemoglobin A1C: 8.9 % — AB (ref 4.0–5.6)

## 2022-07-24 MED ORDER — IBUPROFEN 600 MG PO TABS
600.0000 mg | ORAL_TABLET | Freq: Two times a day (BID) | ORAL | 0 refills | Status: DC | PRN
  Filled 2022-07-24 – 2023-01-14 (×2): qty 30, 15d supply, fill #0

## 2022-07-24 MED ORDER — METFORMIN HCL 1000 MG PO TABS
1000.0000 mg | ORAL_TABLET | Freq: Two times a day (BID) | ORAL | 3 refills | Status: DC
  Filled 2022-07-24: qty 180, 90d supply, fill #0
  Filled 2022-08-21: qty 60, 30d supply, fill #0
  Filled 2022-09-18: qty 60, 30d supply, fill #1
  Filled 2022-11-13: qty 60, 30d supply, fill #2
  Filled 2022-12-14: qty 60, 30d supply, fill #3
  Filled 2023-01-14: qty 60, 30d supply, fill #4
  Filled 2023-02-12 – 2023-03-01 (×5): qty 60, 30d supply, fill #5

## 2022-07-24 MED ORDER — LOSARTAN POTASSIUM 25 MG PO TABS
25.0000 mg | ORAL_TABLET | Freq: Every day | ORAL | 0 refills | Status: DC
  Filled 2022-07-24 – 2022-08-21 (×2): qty 90, 90d supply, fill #0

## 2022-07-24 NOTE — Assessment & Plan Note (Signed)
BP Readings from Last 3 Encounters:  07/24/22 (!) 145/101  10/20/21 140/90  10/05/21 133/85  Chronic medical condition uncontrolled BP goal is less than 130 over80 Currently on amlodipine 10 mg daily, hydrochlorothiazide 25 mg daily Start losartan 25 mg daily continue current dose of amlodipine and hydrochlorothiazide DASH diet advised patient encouraged to engage in regular moderate to vigorous exercises at least 150 minutes weekly CMP today BMP in 2 weeks Monitor blood pressure at home keep a log and bring to next follow-up

## 2022-07-24 NOTE — Assessment & Plan Note (Signed)
Currently not on medication Fasting lipid panel ordered Plan to start patient on a statin once lab results

## 2022-07-24 NOTE — Assessment & Plan Note (Signed)
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  Take  ibuprofen '600mg'$  daily as needed alternate with OTC Tylenol every 6 hours as needed. Patient encouraged to avoid taking Goody powder while using ibuprofen. Use of volatren gel gel was encouraged

## 2022-07-24 NOTE — Assessment & Plan Note (Addendum)
Wt Readings from Last 3 Encounters:  07/24/22 215 lb (97.5 kg)  02/22/22 218 lb (98.9 kg)  10/20/21 217 lb 6.4 oz (98.6 kg)   Body mass index is 35.78 kg/m.  Patient counseled on low carb modified diet Encouraged to engage in regular moderate to vigorous exercises at least 150 minutes weekly.  Will consider addition of a GLP1 in 3 months if DM remains uncontrolled.

## 2022-07-24 NOTE — Progress Notes (Signed)
Established Patient Office Visit  Subjective:  Patient ID: Susan Mathis, female    DOB: August 09, 1971  Age: 51 y.o. MRN: [id number]  CC:  Chief Complaint  Patient presents with   Chest Pain    Pt stated--went to urgent care for chest stabbing pain,coughing w/ yellow mucus and blood.--2 months    HPI Susan Mathis is a 51 y.o. female with past medical history of uncontrolled type 2 diabetes, hypertension, hyperlipidemia, costochondritis, anemia presents for complaints of left-sided chest pain.  Patient complains of worsening chronic left-sided chest pain.  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  She has been taking ibuprofen and Goody powder as needed.  Her chest pain is sometimes associated with shortness of breath and dizziness from time to time.  She stated that she was evaluated in the urgent care some months ago  and they told her that her pain was musculoskeletal in nature.  She was evaluated by cardiology earlier this year, they thought her symptoms was also musculoskeletal in nature..   Patient complains of pain in the left breast, tingling sensation in the nipple , She denies lump, skin discoloration, nipple discharge she is due for screening mammogram referral placed today.   Uncontrolled type 2 diabetes.  Patient stated that she has been out of metformin for about 3 months.  Drinks a lot of juice.  She reports polyuria, polydipsia, polyphagia.   Due for shingles vaccine, flu vaccine , need for both vaccines dicussed today , she declined flu vaccine in the office today .   Past Medical History:  Diagnosis Date   Fibroids    Hypertension    Migraines     Past Surgical History:  Procedure Laterality Date   CESAREAN SECTION      Family History  Problem Relation Age of Onset   Hypertension Mother    Hypertension Father     Social History   Socioeconomic History   Marital status: Single    Spouse  name: Not on file   Number of children: Not on file   Years of education: Not on file   Highest education level: Not on file  Occupational History   Not on file  Tobacco Use   Smoking status: Never   Smokeless tobacco: Never  Vaping Use   Vaping Use: Never used  Substance and Sexual Activity   Alcohol use: Not Currently   Drug use: Never   Sexual activity: Not on file  Other Topics Concern   Not on file  Social History Narrative   Not on file   Social Determinants of Health   Financial Resource Strain: Not on file  Food Insecurity: Not on file  Transportation Needs: Not on file  Physical Activity: Not on file  Stress: Not on file  Social Connections: Not on file  Intimate Partner Violence: Not on file    Outpatient Medications Prior to Visit  Medication Sig Dispense Refill   amLODipine (NORVASC) 10 MG tablet Take 1 tablet (10 mg total) by mouth once daily. 90 tablet 3   Aspirin-Acetaminophen-Caffeine (GOODY HEADACHE PO) Take by mouth.     Blood Glucose Monitoring Suppl (TRUE METRIX METER) w/Device KIT 1 kit by Does not apply route in the morning, at noon, in the evening, and at bedtime. 1 kit 0   glucose blood (TRUE METRIX BLOOD GLUCOSE TEST) test strip Use as instructed 100 each 12  hydrochlorothiazide (HYDRODIURIL) 25 MG tablet Take 1 tablet (25 mg total) by mouth once daily. 90 tablet 3   TRUEplus Lancets 28G MISC use as directed in the morning, at noon, in the evening, and at bedtime. 210 each 11   ibuprofen (ADVIL) 800 MG tablet TAKE 1 TABLET BY MOUTH TWICE DAILY WITH MEALS AS NEEDED FOR PAIN 30 tablet 1   Ferrous Fumarate (HEMOCYTE - 106 MG FE) 324 (106 Fe) MG TABS tablet Take 1 tablet (106 mg of iron total) by mouth daily. 90 tablet 0   metFORMIN (GLUCOPHAGE) 500 MG tablet Take 2 tablets (1094m) by mouth twice daily. 60 tablet 2   No facility-administered medications prior to visit.    No Known Allergies  ROS Review of Systems  Constitutional:  Negative for  activity change, appetite change, chills, diaphoresis, fatigue and fever.  Respiratory:  Negative for chest tightness, shortness of breath and wheezing.   Cardiovascular:  Positive for chest pain. Negative for palpitations and leg swelling.  Gastrointestinal:  Negative for abdominal distention, abdominal pain and anal bleeding.  Endocrine: Positive for polydipsia, polyphagia and polyuria.  Skin: Negative.   Neurological:  Positive for dizziness. Negative for seizures, speech difficulty, weakness and numbness.  Psychiatric/Behavioral:  Negative for agitation, behavioral problems, confusion and decreased concentration.       Objective:    Physical Exam Constitutional:      General: She is not in acute distress.    Appearance: She is obese. She is not ill-appearing, toxic-appearing or diaphoretic.  Eyes:     Extraocular Movements: Extraocular movements intact.  Cardiovascular:     Rate and Rhythm: Normal rate and regular rhythm.     Chest Wall: PMI is not displaced.     Pulses:          Radial pulses are 2+ on the right side and 2+ on the left side.     Heart sounds: Normal heart sounds.  Pulmonary:     Effort: Pulmonary effort is normal. No tachypnea, accessory muscle usage or respiratory distress.     Breath sounds: No stridor. No decreased breath sounds, wheezing, rhonchi or rales.  Chest:     Chest wall: Tenderness present. No mass, deformity, crepitus or edema.  Abdominal:     General: There is no abdominal bruit.     Palpations: Abdomen is soft. There is no fluid wave or hepatomegaly.  Musculoskeletal:     Right lower leg: No tenderness. No edema.     Left lower leg: No tenderness. No edema.  Skin:    General: Skin is warm and dry.     Capillary Refill: Capillary refill takes less than 2 seconds.     Coloration: Skin is not cyanotic or pale.     Findings: No ecchymosis.  Neurological:     Mental Status: She is alert and oriented to person, place, and time.     Cranial  Nerves: No cranial nerve deficit.     Motor: No weakness.  Psychiatric:        Mood and Affect: Mood normal. Mood is not anxious.        Behavior: Behavior normal. Behavior is not agitated.     BP (!) 145/101   Pulse 72   Temp 98.3 F (36.8 C)   Ht _0  (1.651 m)   Wt 215 lb (97.5 kg)   SpO2 99%   BMI 35.78 kg/m  Wt Readings from Last 3 Encounters:  07/24/22 215 lb (97.5 kg)  02/22/22  218 lb (98.9 kg)  10/20/21 217 lb 6.4 oz (98.6 kg)    Lab Results  Component Value Date   TSH 1.600 03/24/2020   Lab Results  Component Value Date   WBC 7.4 03/24/2020   HGB 13.8 03/24/2020   HCT 42.8 03/24/2020   MCV 84 03/24/2020   PLT 286 03/24/2020   Lab Results  Component Value Date   NA 137 11/10/2021   K 3.8 11/10/2021   CO2 21 11/10/2021   GLUCOSE 197 (H) 11/10/2021   BUN 14 11/10/2021   CREATININE 0.77 11/10/2021   BILITOT 0.2 11/10/2021   ALKPHOS 66 11/10/2021   AST 14 11/10/2021   ALT 20 11/10/2021   PROT 6.9 11/10/2021   ALBUMIN 4.5 11/10/2021   CALCIUM 9.8 11/10/2021   ANIONGAP 12 03/12/2020   EGFR 93 11/10/2021   Lab Results  Component Value Date   CHOL 223 (H) 11/10/2021   Lab Results  Component Value Date   HDL 49 11/10/2021   Lab Results  Component Value Date   LDLCALC 149 (H) 11/10/2021   Lab Results  Component Value Date   TRIG 139 11/10/2021   Lab Results  Component Value Date   CHOLHDL 4.6 (H) 11/10/2021   Lab Results  Component Value Date   HGBA1C 8.9 (A) 07/24/2022      Assessment & Plan:   Problem List Items Addressed This Visit       Cardiovascular and Mediastinum   Essential hypertension (Chronic)    BP Readings from Last 3 Encounters:  07/24/22 (!) 145/101  10/20/21 140/90  10/05/21 133/85  Chronic medical condition uncontrolled BP goal is less than 130 over80 Currently on amlodipine 10 mg daily, hydrochlorothiazide 25 mg daily Start losartan 25 mg daily continue current dose of amlodipine and  hydrochlorothiazide DASH diet advised patient encouraged to engage in regular moderate to vigorous exercises at least 150 minutes weekly CMP today BMP in 2 weeks Monitor blood pressure at home keep a log and bring to next follow-up       Relevant Medications   losartan (COZAAR) 25 MG tablet   Other Relevant Orders   Basic Metabolic Panel     Endocrine   Hyperlipidemia associated with type 2 diabetes mellitus (HCC) (Chronic)    Currently not on medication Fasting lipid panel ordered Plan to start patient on a statin once lab results      Relevant Medications   metFORMIN (GLUCOPHAGE) 1000 MG tablet   losartan (COZAAR) 25 MG tablet   Uncontrolled type 2 diabetes mellitus with hyperglycemia (HCC) - Primary    Lab Results  Component Value Date   HGBA1C 7.8 (H) 11/10/2021   Lab Results  Component Value Date   HGBA1C 8.9 (A) 07/24/2022    Uncontrolled condition Recent A1c 8.9 She has stopped taking metformin about 3 months ago Restart metformin 1000 mg twice daily Patient counseled on low-carb modified diet Check urine creatinine albumin ratio, referral to ophthalmology for diabetic eye exam placed. Check fasting lipid panel in 2 weeks Foot exam in 4 weeks Blood sugar goals were discussed with patient .       Relevant Medications   metFORMIN (GLUCOPHAGE) 1000 MG tablet   losartan (COZAAR) 25 MG tablet   Other Relevant Orders   POCT glycosylated hemoglobin (Hb A1C) (Completed)   Ambulatory referral to Ophthalmology   Urine microalbumin-creatinine with uACR   CMP14+EGFR   Lipid Panel     Musculoskeletal and Integument   Costochondritis    Does  lift her mom at home.  Take  ibuprofen 632m daily as needed alternate with OTC Tylenol every 6 hours as needed. Patient encouraged to avoid taking Goody powder while using ibuprofen. Use of volatren gel gel was encouraged        Other   Obesity    Wt Readings from Last 3 Encounters:  07/24/22 215 lb (97.5 kg)  02/22/22  218 lb (98.9 kg)  10/20/21 217 lb 6.4 oz (98.6 kg)   Body mass index is 35.78 kg/m.  Patient counseled on low carb modified diet Encouraged to engage in regular moderate to vigorous exercises at least 150 minutes weekly.  Will consider addition of a GLP1 in 3 months if DM remains uncontrolled.       Relevant Medications   metFORMIN (GLUCOPHAGE) 1000 MG tablet   Other Visit Diagnoses     Encounter for screening mammogram for malignant neoplasm of breast       Relevant Orders   MM Digital Screening   Screening for colon cancer       Relevant Orders   Cologuard       Meds ordered this encounter  Medications   metFORMIN (GLUCOPHAGE) 1000 MG tablet    Sig: Take 1 tablet (1,000 mg total) by mouth 2 (two) times daily with a meal.    Dispense:  180 tablet    Refill:  3   ibuprofen (ADVIL) 600 MG tablet    Sig: Take 1 tablet (600 mg total) by mouth 2 (two) times daily as needed.    Dispense:  30 tablet    Refill:  0   losartan (COZAAR) 25 MG tablet    Sig: Take 1 tablet (25 mg total) by mouth daily.    Dispense:  90 tablet    Refill:  0    Follow-up: Return in about 4 weeks (around 08/21/2022) for CPE/ with pap.    FRenee Rival FNP

## 2022-07-24 NOTE — Assessment & Plan Note (Addendum)
Lab Results  Component Value Date   HGBA1C 7.8 (H) 11/10/2021   Lab Results  Component Value Date   HGBA1C 8.9 (A) 07/24/2022    Uncontrolled condition Recent A1c 8.9 She has stopped taking metformin about 3 months ago Restart metformin 1000 mg twice daily Patient counseled on low-carb modified diet Check urine creatinine albumin ratio, referral to ophthalmology for diabetic eye exam placed. Check fasting lipid panel in 2 weeks Foot exam in 4 weeks Blood sugar goals were discussed with patient .

## 2022-07-24 NOTE — Patient Instructions (Addendum)
1. Uncontrolled type 2 diabetes mellitus with hyperglycemia (HCC) - metFORMIN (GLUCOPHAGE) 1000 MG tablet; Take 1 tablet (1,000 mg total) by mouth 2 (two) times daily with a meal.  Dispense: 180 tablet; Refill: 3  2. Encounter for screening mammogram for malignant neoplasm of breast Please call [phone] to schedule your mammogram   3. Essential hypertension  - losartan (COZAAR) 25 MG tablet; Take 1 tablet (25 mg total) by mouth daily.  Dispense: 90 tablet; Refill: 0  Around 3 times per week, check your blood pressure 2 times per day. once in the morning and once in the evening. The readings should be at least one minute apart. Write down these values and bring them to your next nurse visit/appointment.  When you check your BP, make sure you have been doing something calm/relaxing 5 minutes prior to checking. Both feet should be flat on the floor and you should be sitting. Use your left arm and make sure it is in a relaxed position (on a table), and that the cuff is at the approximate level/height of your heart.     It is important that you exercise regularly at least 30 minutes 5 times a week as tolerated  Think about what you will eat, plan ahead. Choose " clean, green, fresh or frozen" over canned, processed or packaged foods which are more sugary, salty and fatty. 70 to 75% of food eaten should be vegetables and fruit. Three meals at set times with snacks allowed between meals, but they must be fruit or vegetables. Aim to eat over a 12 hour period , example 7 am to 7 pm, and STOP after  your last meal of the day. Drink water,generally about 64 ounces per day, no other drink is as healthy. Fruit juice is best enjoyed in a healthy way, by EATING the fruit.  Thanks for choosing [facility] we consider it a privelige to serve you.

## 2022-07-25 ENCOUNTER — Other Ambulatory Visit: Payer: Self-pay

## 2022-07-25 LAB — CMP14+EGFR
ALT: 18 IU/L (ref 0–32)
AST: 18 IU/L (ref 0–40)
Albumin/Globulin Ratio: 1.8 (ref 1.2–2.2)
Albumin: 4.9 g/dL (ref 3.8–4.9)
Alkaline Phosphatase: 82 IU/L (ref 44–121)
BUN/Creatinine Ratio: 20 (ref 9–23)
BUN: 12 mg/dL (ref 6–24)
Bilirubin Total: 0.2 mg/dL (ref 0.0–1.2)
CO2: 22 mmol/L (ref 20–29)
Calcium: 10 mg/dL (ref 8.7–10.2)
Chloride: 96 mmol/L (ref 96–106)
Creatinine, Ser: 0.6 mg/dL (ref 0.57–1.00)
Globulin, Total: 2.8 g/dL (ref 1.5–4.5)
Glucose: 239 mg/dL — ABNORMAL HIGH (ref 70–99)
Potassium: 3.8 mmol/L (ref 3.5–5.2)
Sodium: 136 mmol/L (ref 134–144)
Total Protein: 7.7 g/dL (ref 6.0–8.5)
eGFR: 108 mL/min/{1.73_m2} (ref >59)

## 2022-07-25 NOTE — Progress Notes (Signed)
Normal kidney function, glucose is high due to her uncontrolled DM , take all medications as prescribed.

## 2022-07-26 LAB — MICROALBUMIN / CREATININE URINE RATIO
Creatinine, Urine: 196.4 mg/dL
Microalb/Creat Ratio: 403 mg/g creat — ABNORMAL HIGH (ref 0–29)
Microalbumin, Urine: 791.7 ug/mL

## 2022-07-27 ENCOUNTER — Other Ambulatory Visit: Payer: Self-pay

## 2022-07-27 ENCOUNTER — Other Ambulatory Visit: Payer: Self-pay | Admitting: Nurse Practitioner

## 2022-07-27 DIAGNOSIS — E1129 Type 2 diabetes mellitus with other diabetic kidney complication: Secondary | ICD-10-CM

## 2022-07-27 DIAGNOSIS — E1165 Type 2 diabetes mellitus with hyperglycemia: Secondary | ICD-10-CM

## 2022-07-27 DIAGNOSIS — Z87898 Personal history of other specified conditions: Secondary | ICD-10-CM

## 2022-07-27 DIAGNOSIS — Z1231 Encounter for screening mammogram for malignant neoplasm of breast: Secondary | ICD-10-CM

## 2022-07-27 MED ORDER — EMPAGLIFLOZIN 10 MG PO TABS
10.0000 mg | ORAL_TABLET | Freq: Every day | ORAL | 0 refills | Status: DC
  Filled 2022-07-27: qty 90, 90d supply, fill #0

## 2022-07-27 NOTE — Progress Notes (Signed)
Patient should start taking Jardiance 10 mg daily for kidney protection and to also help get her diabetes under control.  She should avoid fried fatty foods eat small amounts of meals each time to avoid nausea vomiting which are common side effects of this medication.  She should report abdominal pain.  Continue current medications . follow-up in 4 weeks as planned.  She should let us know if she has any trouble getting this medication.

## 2022-07-30 ENCOUNTER — Other Ambulatory Visit: Payer: Self-pay

## 2022-07-31 ENCOUNTER — Other Ambulatory Visit: Payer: Self-pay

## 2022-08-01 DIAGNOSIS — Z1211 Encounter for screening for malignant neoplasm of colon: Secondary | ICD-10-CM | POA: Diagnosis not present

## 2022-08-02 ENCOUNTER — Other Ambulatory Visit: Payer: Self-pay

## 2022-08-07 ENCOUNTER — Other Ambulatory Visit: Payer: Self-pay

## 2022-08-07 NOTE — Telephone Encounter (Signed)
Caller & Relationship to patient:  MRN #  [id number]   Call Back Number:   Date of Last Office Visit: 07/11/2022     Date of Next Office Visit: 07/24/2022    Medication(s) to be Refilled: Metformin instead of Jardiance  Preferred Pharmacy:   ** Please notify patient to allow 48-72 hours to process** **Let patient know to contact pharmacy at the end of the day to make sure medication is ready. ** **If patient has not been seen in a year or longer, book an appointment **Advise to use MyChart for refill requests OR to contact their pharmacy

## 2022-08-08 LAB — COLOGUARD: COLOGUARD: NEGATIVE

## 2022-08-08 NOTE — Progress Notes (Signed)
Normal test

## 2022-08-09 ENCOUNTER — Other Ambulatory Visit: Payer: Self-pay

## 2022-08-09 MED ORDER — METFORMIN HCL 500 MG PO TABS
500.0000 mg | ORAL_TABLET | Freq: Two times a day (BID) | ORAL | 2 refills | Status: DC
  Filled 2022-08-09: qty 60, 15d supply, fill #0
  Filled 2022-08-16: qty 60, 30d supply, fill #0

## 2022-08-09 NOTE — Telephone Encounter (Signed)
Please advise 

## 2022-08-15 ENCOUNTER — Other Ambulatory Visit: Payer: Self-pay

## 2022-08-16 ENCOUNTER — Other Ambulatory Visit: Payer: Self-pay

## 2022-08-16 ENCOUNTER — Other Ambulatory Visit: Payer: Medicaid Other

## 2022-08-16 DIAGNOSIS — E1165 Type 2 diabetes mellitus with hyperglycemia: Secondary | ICD-10-CM | POA: Diagnosis not present

## 2022-08-16 DIAGNOSIS — I10 Essential (primary) hypertension: Secondary | ICD-10-CM

## 2022-08-17 ENCOUNTER — Other Ambulatory Visit: Payer: Self-pay | Admitting: Nurse Practitioner

## 2022-08-17 ENCOUNTER — Other Ambulatory Visit: Payer: Self-pay

## 2022-08-17 DIAGNOSIS — E785 Hyperlipidemia, unspecified: Secondary | ICD-10-CM

## 2022-08-17 DIAGNOSIS — I10 Essential (primary) hypertension: Secondary | ICD-10-CM

## 2022-08-17 LAB — BASIC METABOLIC PANEL
BUN/Creatinine Ratio: 20 (ref 9–23)
BUN: 12 mg/dL (ref 6–24)
CO2: 23 mmol/L (ref 20–29)
Calcium: 9.8 mg/dL (ref 8.7–10.2)
Chloride: 99 mmol/L (ref 96–106)
Creatinine, Ser: 0.6 mg/dL (ref 0.57–1.00)
Glucose: 188 mg/dL — ABNORMAL HIGH (ref 70–99)
Potassium: 3.7 mmol/L (ref 3.5–5.2)
Sodium: 139 mmol/L (ref 134–144)
eGFR: 108 mL/min/{1.73_m2} (ref >59)

## 2022-08-17 LAB — LIPID PANEL
Chol/HDL Ratio: 4.4 ratio (ref 0.0–4.4)
Cholesterol, Total: 212 mg/dL — ABNORMAL HIGH (ref 100–199)
HDL: 48 mg/dL (ref >39)
LDL Chol Calc (NIH): 151 mg/dL — ABNORMAL HIGH (ref 0–99)
Triglycerides: 72 mg/dL (ref 0–149)
VLDL Cholesterol Cal: 13 mg/dL (ref 5–40)

## 2022-08-17 MED ORDER — ATORVASTATIN CALCIUM 10 MG PO TABS
10.0000 mg | ORAL_TABLET | Freq: Every day | ORAL | 0 refills | Status: DC
  Filled 2022-08-17: qty 90, 90d supply, fill #0

## 2022-08-17 NOTE — Progress Notes (Signed)
LDL goal is less than 70.  Patient should start taking atorvastatin 10 mg daily Eat a healthy diet, including lots of fruits and vegetables. Avoid foods with a lot of saturated and trans fats, such as red meat, butter, fried foods and cheese . Maintain a healthy weight.  Normal kidney function and electrolytes. Follow-up as planned.

## 2022-08-21 ENCOUNTER — Ambulatory Visit (INDEPENDENT_AMBULATORY_CARE_PROVIDER_SITE_OTHER): Payer: Medicaid Other | Admitting: Nurse Practitioner

## 2022-08-21 ENCOUNTER — Other Ambulatory Visit: Payer: Self-pay

## 2022-08-21 ENCOUNTER — Encounter: Payer: Self-pay | Admitting: Nurse Practitioner

## 2022-08-21 VITALS — BP 134/87 | Temp 97.9°F | Ht 65.0 in | Wt 217.0 lb

## 2022-08-21 DIAGNOSIS — E1165 Type 2 diabetes mellitus with hyperglycemia: Secondary | ICD-10-CM

## 2022-08-21 DIAGNOSIS — E1169 Type 2 diabetes mellitus with other specified complication: Secondary | ICD-10-CM | POA: Diagnosis not present

## 2022-08-21 DIAGNOSIS — I10 Essential (primary) hypertension: Secondary | ICD-10-CM | POA: Diagnosis not present

## 2022-08-21 DIAGNOSIS — E785 Hyperlipidemia, unspecified: Secondary | ICD-10-CM

## 2022-08-21 DIAGNOSIS — J302 Other seasonal allergic rhinitis: Secondary | ICD-10-CM

## 2022-08-21 DIAGNOSIS — R110 Nausea: Secondary | ICD-10-CM | POA: Diagnosis not present

## 2022-08-21 MED ORDER — FLUTICASONE PROPIONATE 50 MCG/ACT NA SUSP
2.0000 | Freq: Every day | NASAL | 6 refills | Status: AC
  Filled 2022-08-21: qty 16, 30d supply, fill #0
  Filled 2023-02-12 – 2023-02-22 (×2): qty 16, 30d supply, fill #1

## 2022-08-21 MED ORDER — ONDANSETRON HCL 4 MG PO TABS
4.0000 mg | ORAL_TABLET | Freq: Three times a day (TID) | ORAL | 0 refills | Status: AC | PRN
  Filled 2022-08-21: qty 20, 7d supply, fill #0

## 2022-08-21 NOTE — Patient Instructions (Addendum)
Labs in 2 weeks to check your kidney function   . Nausea  - ondansetron (ZOFRAN) 4 MG tablet; Take 1 tablet (4 mg total) by mouth every 8 (eight) hours as needed for nausea or vomiting.  Dispense: 20 tablet; Refill: 0  . Seasonal allergies  - fluticasone (FLONASE) 50 MCG/ACT nasal spray; Place 2 sprays into both nostrils daily.  Dispense: 16 g; Refill: 6   It is important that you exercise regularly at least 30 minutes 5 times a week as tolerated  Think about what you will eat, plan ahead. Choose " clean, green, fresh or frozen" over canned, processed or packaged foods which are more sugary, salty and fatty. 70 to 75% of food eaten should be vegetables and fruit. Three meals at set times with snacks allowed between meals, but they must be fruit or vegetables. Aim to eat over a 12 hour period , example 7 am to 7 pm, and STOP after  your last meal of the day. Drink water,generally about 64 ounces per day, no other drink is as healthy. Fruit juice is best enjoyed in a healthy way, by EATING the fruit.  Thanks for choosing [facility] we consider it a privelige to serve you.

## 2022-08-21 NOTE — Assessment & Plan Note (Signed)
Lab Results  Component Value Date   HGBA1C 8.9 (A) 07/24/2022  Patient encouraged to start taking metformin 1000 mg twice daily She is willing to try Jardiance again, she stopped taking Jardiance after 3 days due to nausea.  Patient encouraged to avoid fried fatty foods and eat smaller portion of meals to help decrease nausea.  Zofran 4 mg every 8 hours as needed for nausea ordered. Patient counseled on low-carb modified diet Diabetic foot exam completed today Follow-up in 6 weeks

## 2022-08-21 NOTE — Assessment & Plan Note (Signed)
Flonase nasal spray 2 spray into each nostril once daily ordered.

## 2022-08-21 NOTE — Assessment & Plan Note (Signed)
Patient encouraged to pick up prescription for atorvastatin 10 mg daily, to help reduce the risk of heart attack, stroke LDL goal is less than 70.  Lab Results  Component Value Date   CHOL 212 (H) 08/16/2022   HDL 48 08/16/2022   LDLCALC 151 (H) 08/16/2022   TRIG 72 08/16/2022   CHOLHDL 4.4 08/16/2022

## 2022-08-21 NOTE — Assessment & Plan Note (Signed)
BP Readings from Last 3 Encounters:  08/21/22 134/87  07/24/22 (!) 145/101  10/20/21 140/90  Blood pressure goal is less than 130/80 Continue amlodipine 10 mg daily, hydrochlorothiazide 25 mg daily.  Losartan was ordered at her previous visit where she has not been taking the medication .start losartan 25 mg daily. DASH diet advised engage in regular moderate to vigorous exercises at least 250 minutes weekly Follow-up in 6 weeks BMP in 2 weeks

## 2022-08-21 NOTE — Progress Notes (Signed)
Established Patient Office Visit  Subjective:  Patient ID: Susan Mathis, female    DOB: [birth year]  Age: 52 y.o. MRN: [id number]  CC:  Chief Complaint  Patient presents with   Follow-up    Jardiance--feeling nausea, fatigue. And taking metformin.    HPI Susan Mathis is a 52 y.o. female with past medical history of uncontrolled type 2 diabetes, hypertension, hyperlipidemia, abnormal uterine bleeding presents for follow-up for diabetes and hypertension.  Uncontrolled Type 2 diabetes.  She is currently on metformin 1000 mg twice daily, it appears that the patient has been taking metformin 1017m daily.  She stated that she stopped taking Jardiance 173mdaily  after 3 days due to being nauseous.  She denies vomiting, abdominal pain.  Hypertension.  Currently on amlodipine 10 mg daily, hydrochlorothiazide 25 mg daily.  She was prescribed losartan at her last visit but stated that she has no picked up this medication from the pharmacy.  She denies dizziness, syncope, edema   Seasonal allergies . patient complains of stuffy nose, sneezing, cough that is worse at night.  Stated that she has not been taking any medication for her allergies because she was told that it could worsen her hypertension.   Due for Pap exam, patient preferred to have this done at her next visit.  She was encouraged to consider getting her flu vaccine and shingles vaccine.      Past Medical History:  Diagnosis Date   Fibroids    Hypertension    Migraines     Past Surgical History:  Procedure Laterality Date   CESAREAN SECTION      Family History  Problem Relation Age of Onset   Hypertension Mother    Hypertension Father     Social History   Socioeconomic History   Marital status: Single    Spouse name: Not on file   Number of children: Not on file   Years of education: Not on file   Highest education level: Not on file  Occupational History   Not on file  Tobacco Use   Smoking status:  Never   Smokeless tobacco: Never  Vaping Use   Vaping Use: Never used  Substance and Sexual Activity   Alcohol use: Not Currently   Drug use: Never   Sexual activity: Not on file  Other Topics Concern   Not on file  Social History Narrative   Not on file   Social Determinants of Health   Financial Resource Strain: Not on file  Food Insecurity: Not on file  Transportation Needs: Not on file  Physical Activity: Not on file  Stress: Not on file  Social Connections: Not on file  Intimate Partner Violence: Not on file    Outpatient Medications Prior to Visit  Medication Sig Dispense Refill   amLODipine (NORVASC) 10 MG tablet Take 1 tablet (10 mg total) by mouth once daily. 90 tablet 3   Aspirin-Acetaminophen-Caffeine (GOODY HEADACHE PO) Take by mouth.     atorvastatin (LIPITOR) 10 MG tablet Take 1 tablet (10 mg total) by mouth daily. 90 tablet 0   Blood Glucose Monitoring Suppl (TRUE METRIX METER) w/Device KIT 1 kit by Does not apply route in the morning, at noon, in the evening, and at bedtime. 1 kit 0   glucose blood (TRUE METRIX BLOOD GLUCOSE TEST) test strip Use as instructed 100 each 12   hydrochlorothiazide (HYDRODIURIL) 25 MG tablet Take 1 tablet (25 mg total) by mouth once daily. 90 tablet 3   ibuprofen (  ADVIL) 600 MG tablet Take 1 tablet (600 mg total) by mouth 2 (two) times daily as needed. 30 tablet 0   losartan (COZAAR) 25 MG tablet Take 1 tablet (25 mg total) by mouth daily. 90 tablet 0   metFORMIN (GLUCOPHAGE) 1000 MG tablet Take 1 tablet (1,000 mg total) by mouth 2 (two) times daily with a meal. 180 tablet 3   TRUEplus Lancets 28G MISC use as directed in the morning, at noon, in the evening, and at bedtime. 210 each 11   metFORMIN (GLUCOPHAGE) 500 MG tablet Take 2 tablets (1057m) by mouth twice daily. 60 tablet 2   empagliflozin (JARDIANCE) 10 MG TABS tablet Take 1 tablet (10 mg total) by mouth daily before breakfast. (Patient not taking: Reported on 08/21/2022) 90  tablet 0   Ferrous Fumarate (HEMOCYTE - 106 MG FE) 324 (106 Fe) MG TABS tablet Take 1 tablet (106 mg of iron total) by mouth daily. 90 tablet 0   No facility-administered medications prior to visit.    No Known Allergies  ROS Review of Systems  Constitutional:  Negative for activity change, appetite change, chills, diaphoresis, fatigue and fever.  HENT:  Positive for congestion and sneezing. Negative for ear discharge, ear pain, facial swelling, hearing loss, mouth sores and nosebleeds.   Respiratory:  Positive for cough. Negative for apnea, choking, chest tightness, shortness of breath, wheezing and stridor.   Cardiovascular: Negative.  Negative for chest pain, palpitations and leg swelling.  Neurological: Negative.  Negative for dizziness, facial asymmetry, light-headedness, numbness and headaches.  Psychiatric/Behavioral: Negative.  Negative for agitation, behavioral problems, confusion and decreased concentration.       Objective:    Physical Exam Constitutional:      General: She is not in acute distress.    Appearance: She is not ill-appearing, toxic-appearing or diaphoretic.  HENT:     Right Ear: Tympanic membrane, ear canal and external ear normal. There is no impacted cerumen.     Left Ear: Tympanic membrane, ear canal and external ear normal. There is no impacted cerumen.     Nose: Congestion and rhinorrhea present.     Mouth/Throat:     Mouth: Mucous membranes are moist.     Pharynx: Oropharynx is clear. No oropharyngeal exudate or posterior oropharyngeal erythema.  Eyes:     General: No scleral icterus.       Right eye: No discharge.        Left eye: No discharge.     Extraocular Movements: Extraocular movements intact.  Cardiovascular:     Rate and Rhythm: Normal rate and regular rhythm.     Pulses: Normal pulses.     Heart sounds: Normal heart sounds. No murmur heard.    No friction rub. No gallop.  Pulmonary:     Effort: Pulmonary effort is normal. No  respiratory distress.     Breath sounds: Normal breath sounds. No stridor. No wheezing, rhonchi or rales.  Chest:     Chest wall: No tenderness.  Abdominal:     General: There is no distension.     Palpations: Abdomen is soft.     Tenderness: There is no abdominal tenderness.  Musculoskeletal:        General: No swelling, tenderness, deformity or signs of injury.     Right lower leg: No edema.     Left lower leg: No edema.  Skin:    General: Skin is warm and dry.     Coloration: Skin is not jaundiced or pale.  Findings: No bruising or erythema.  Neurological:     Mental Status: She is alert.  Psychiatric:        Mood and Affect: Mood normal.        Behavior: Behavior normal.        Thought Content: Thought content normal.        Judgment: Judgment normal.     BP 134/87   Temp 97.9 F (36.6 C)   Ht _0  (1.651 m)   Wt 217 lb (98.4 kg)   SpO2 98%   BMI 36.11 kg/m  Wt Readings from Last 3 Encounters:  08/21/22 217 lb (98.4 kg)  07/24/22 215 lb (97.5 kg)  02/22/22 218 lb (98.9 kg)    Lab Results  Component Value Date   TSH 1.600 03/24/2020   Lab Results  Component Value Date   WBC 7.4 03/24/2020   HGB 13.8 03/24/2020   HCT 42.8 03/24/2020   MCV 84 03/24/2020   PLT 286 03/24/2020   Lab Results  Component Value Date   NA 139 08/16/2022   K 3.7 08/16/2022   CO2 23 08/16/2022   GLUCOSE 188 (H) 08/16/2022   BUN 12 08/16/2022   CREATININE 0.60 08/16/2022   BILITOT 0.2 07/24/2022   ALKPHOS 82 07/24/2022   AST 18 07/24/2022   ALT 18 07/24/2022   PROT 7.7 07/24/2022   ALBUMIN 4.9 07/24/2022   CALCIUM 9.8 08/16/2022   ANIONGAP 12 03/12/2020   EGFR 108 08/16/2022   Lab Results  Component Value Date   CHOL 212 (H) 08/16/2022   Lab Results  Component Value Date   HDL 48 08/16/2022   Lab Results  Component Value Date   LDLCALC 151 (H) 08/16/2022   Lab Results  Component Value Date   TRIG 72 08/16/2022   Lab Results  Component Value Date    CHOLHDL 4.4 08/16/2022   Lab Results  Component Value Date   HGBA1C 8.9 (A) 07/24/2022      Assessment & Plan:   Problem List Items Addressed This Visit       Cardiovascular and Mediastinum   Essential hypertension - Primary (Chronic)    BP Readings from Last 3 Encounters:  08/21/22 134/87  07/24/22 (!) 145/101  10/20/21 140/90  Blood pressure goal is less than 130/80 Continue amlodipine 10 mg daily, hydrochlorothiazide 25 mg daily.  Losartan was ordered at her previous visit where she has not been taking the medication .start losartan 25 mg daily. DASH diet advised engage in regular moderate to vigorous exercises at least 250 minutes weekly Follow-up in 6 weeks BMP in 2 weeks      Relevant Orders   Basic Metabolic Panel     Endocrine   Hyperlipidemia associated with type 2 diabetes mellitus ([place]) (Chronic)    Patient encouraged to pick up prescription for atorvastatin 10 mg daily, to help reduce the risk of heart attack, stroke LDL goal is less than 70.  Lab Results  Component Value Date   CHOL 212 (H) 08/16/2022   HDL 48 08/16/2022   LDLCALC 151 (H) 08/16/2022   TRIG 72 08/16/2022   CHOLHDL 4.4 08/16/2022         Uncontrolled type 2 diabetes mellitus with hyperglycemia (HCC)    Lab Results  Component Value Date   HGBA1C 8.9 (A) 07/24/2022  Patient encouraged to start taking metformin 1000 mg twice daily She is willing to try Jardiance again, she stopped taking Jardiance after 3 days due to nausea.  Patient  encouraged to avoid fried fatty foods and eat smaller portion of meals to help decrease nausea.  Zofran 4 mg every 8 hours as needed for nausea ordered. Patient counseled on low-carb modified diet Diabetic foot exam completed today Follow-up in 6 weeks      Relevant Medications   ondansetron (ZOFRAN) 4 MG tablet     Other   Seasonal allergies    Flonase nasal spray 2 spray into each nostril once daily ordered.       Relevant Medications    fluticasone (FLONASE) 50 MCG/ACT nasal spray   Nausea    Take Zofran 4 mg every 8 hours as needed for nausea while taking Jardiance      Relevant Medications   ondansetron (ZOFRAN) 4 MG tablet    Meds ordered this encounter  Medications   ondansetron (ZOFRAN) 4 MG tablet    Sig: Take 1 tablet (4 mg total) by mouth every 8 (eight) hours as needed for nausea or vomiting.    Dispense:  20 tablet    Refill:  0   fluticasone (FLONASE) 50 MCG/ACT nasal spray    Sig: Place 2 sprays into both nostrils daily.    Dispense:  16 g    Refill:  6    Follow-up: Return in about 6 weeks (around 10/02/2022) for cpe/hld.    Renee Rival, FNP

## 2022-08-21 NOTE — Assessment & Plan Note (Signed)
Take Zofran 4 mg every 8 hours as needed for nausea while taking Jardiance

## 2022-08-22 ENCOUNTER — Other Ambulatory Visit: Payer: Self-pay

## 2022-08-23 ENCOUNTER — Ambulatory Visit: Payer: Medicaid Other | Admitting: Nurse Practitioner

## 2022-09-04 ENCOUNTER — Other Ambulatory Visit: Payer: Self-pay

## 2022-09-06 ENCOUNTER — Other Ambulatory Visit: Payer: Self-pay

## 2022-09-06 DIAGNOSIS — Z1231 Encounter for screening mammogram for malignant neoplasm of breast: Secondary | ICD-10-CM

## 2022-09-21 ENCOUNTER — Other Ambulatory Visit: Payer: Self-pay

## 2022-10-03 ENCOUNTER — Ambulatory Visit: Payer: Self-pay | Admitting: Nurse Practitioner

## 2022-10-22 ENCOUNTER — Other Ambulatory Visit: Payer: Self-pay

## 2022-11-06 ENCOUNTER — Telehealth: Payer: Self-pay

## 2022-11-06 NOTE — Progress Notes (Signed)
   Care Guide Note  11/06/2022 Name: Susan Mathis MRN: [id number] DOB: [birth year]  Referred by: Renee Rival, FNP Reason for referral : Care Coordination (Outreach to schedule with Pharm d NEW MM DM )   Susan Mathis is a 52 y.o. year old female who is a primary care patient of Renee Rival, FNP. Susan Mathis was referred to the pharmacist for assistance related to DM.    An unsuccessful telephone outreach was attempted today to contact the patient who was referred to the pharmacy team for assistance with medication management. Additional attempts will be made to contact the patient.   Noreene Larsson, [place], [place] [address] Direct Dial: [phone] [email or web address]

## 2022-11-14 ENCOUNTER — Other Ambulatory Visit: Payer: Self-pay

## 2022-11-19 ENCOUNTER — Other Ambulatory Visit: Payer: Self-pay

## 2022-12-04 NOTE — Progress Notes (Signed)
   Care Guide Note  12/04/2022 Name: Susan Mathis MRN: [id number] DOB: 1970-09-29  Referred by: Donell Beers, FNP Reason for referral : Care Coordination (Outreach to schedule with Pharm d NEW MM DM )   Susan Mathis is a 52 y.o. year old female who is a primary care patient of Donell Beers, FNP. Susan Mathis was referred to the pharmacist for assistance related to DM.    A second unsuccessful telephone outreach was attempted today to contact the patient who was referred to the pharmacy team for assistance with medication management. Additional attempts will be made to contact the patient.  Penne Lash, RMA Care Guide [facility]  [place], [place] [address] Direct Dial: [phone] [email or web address]

## 2022-12-13 ENCOUNTER — Telehealth: Payer: Self-pay

## 2022-12-13 NOTE — Telephone Encounter (Signed)
Pt to called to make an office apt.

## 2022-12-13 NOTE — Progress Notes (Signed)
   Care Guide Note  12/13/2022 Name: Susan Mathis MRN: [id number] DOB: 24-Mar-1971  Referred by: Donell Beers, FNP Reason for referral : Care Coordination (Outreach to schedule with Pharm d NEW MM DM )   Susan Mathis is a 52 y.o. year old female who is a primary care patient of Donell Beers, FNP. Susan Mathis was referred to the pharmacist for assistance related to DM.    A third unsuccessful telephone outreach was attempted today to contact the patient who was referred to the pharmacy team for assistance with medication management. The Population Health team is pleased to engage with this patient at any time in the future upon receipt of referral and should he/she be interested in assistance from the [facility] team.   Susan Mathis, RMA Care Guide [facility]  [place], [place] [address] Direct Dial: [phone] [email or web address]

## 2022-12-19 ENCOUNTER — Other Ambulatory Visit: Payer: Self-pay

## 2023-01-01 ENCOUNTER — Other Ambulatory Visit: Payer: BLUE CROSS/BLUE SHIELD | Admitting: Pharmacist

## 2023-01-01 NOTE — Patient Instructions (Signed)
[place],   Please continue metformin 1000 mg twice daily.   Focus on lean proteins, fruits and vegetables, whole grains, decreasing sugar beverages as we discussed. Set a physical activity goal and increase as able.   Check your blood sugars twice daily:  1) Fasting, first thing in the morning before breakfast and  2) 2 hours after your largest meal.   For a goal A1c of less than 7%, goal fasting readings are less than 130 and goal 2 hour after meal readings are less than 180.    Check your blood pressure twice weekly, and any time you have concerning symptoms like headache, chest pain, dizziness, shortness of breath, or vision changes.   Our goal is less than 130/80.  To appropriately check your blood pressure, make sure you do the following:  1) Avoid caffeine, exercise, or tobacco products for 30 minutes before checking. Empty your bladder. 2) Sit with your back supported in a flat-backed chair. Rest your arm on something flat (arm of the chair, table, etc). 3) Sit still with your feet flat on the floor, resting, for at least 5 minutes.  4) Check your blood pressure. Take 1-2 readings.  5) Write down these readings and bring with you to any provider appointments.  Bring your home blood pressure machine with you to a provider's office for accuracy comparison at least once a year.   Make sure you take your blood pressure medications before you come to any office visit, even if you were asked to fast for labs.   Please call the office and schedule follow up with [facility]    Thanks!  Catie Eppie Gibson, PharmD, BCACP, CPP [facility] [phone]

## 2023-01-01 NOTE — Progress Notes (Signed)
01/01/2023 Name: Susan Mathis MRN: [id number] DOB: 1970-12-25  No chief complaint on file.   Susan Mathis is a 52 y.o. year old female who presented for a telephone visit.   They were referred to the pharmacist by their PCP for assistance in managing diabetes, hypertension, and hyperlipidemia.   Patient is participating in a Managed Medicaid Plan:  Yes  Subjective:  Care Team: Primary Care Provider: Donell Beers, FNP ; Next Scheduled Visit: not scheduled  Medication Access/Adherence  Current Pharmacy:  [facility] [address], [address] [place] [address] Phone: [phone] Fax: [phone]   Patient reports affordability concerns with their medications: No  Patient reports access/transportation concerns to their pharmacy: No  Patient reports adherence concerns with their medications:  No     Diabetes:  Current medications: metformin 1000 mg twice daily, Jardiance 10 mg daily - reports she has not been taking as it caused fatigue, weakness   Last A1c had been in the setting of running out of metformin  Current glucose readings: evening: after supper: reports a reading of 130-156  Patient denies hypoglycemic s/sx including dizziness, shakiness, sweating. Patient reports hyperglycemic symptoms including polyuria, polydipsia.   Current meal patterns:  - Breakfast: skips breakfast because taking care of her mother;  - Lunch: sausage, grits; or bacon/eggs; sometimes cheese burger depending on time of day; fish sticks;  - Supper: normally cooks for herself and her mother: baked [place] wings; fried food; sometimes does boil meats. Greens, sometimes macaroni and greens  - Snacks: tries to avoid sweets, sometimes popcorn, chips; watermelon, cucumbers;  - Drinks: capri suns; orange juice, apple juice, 1-2 soda; water - big cup of water  Current physical activity: little lately; not in an environment where she  can walk outside, but does clean and do activities around her mother's house   Hypertension:  Current medications: amlodipine 10 mg daily, HCTZ 25 mg daily, losartan 25 mg daily   Patient has a validated, automated, upper arm home BP cuff  Hyperlipidemia/ASCVD Risk Reduction  Current lipid lowering medications: atorvastatin 10 mg daily  Objective:  Lab Results  Component Value Date   HGBA1C 8.9 (A) 07/24/2022    Lab Results  Component Value Date   CREATININE 0.60 08/16/2022   BUN 12 08/16/2022   NA 139 08/16/2022   K 3.7 08/16/2022   CL 99 08/16/2022   CO2 23 08/16/2022    Lab Results  Component Value Date   CHOL 212 (H) 08/16/2022   HDL 48 08/16/2022   LDLCALC 151 (H) 08/16/2022   TRIG 72 08/16/2022   CHOLHDL 4.4 08/16/2022    Medications Reviewed Today     Reviewed by Alden Hipp, RPH-CPP (Pharmacist) on 01/01/23 at 1006  Med List Status: <None>   Medication Order Taking? Sig Documenting Provider Last Dose Status Informant  amLODipine (NORVASC) 10 MG tablet [id number] Yes Take 1 tablet (10 mg total) by mouth once daily. Ivonne Andrew, NP Taking Active   aspirin EC 81 MG tablet [id number] Yes Take 81 mg by mouth daily. Swallow whole. [provider] Taking Active   Aspirin-Acetaminophen-Caffeine (GOODY HEADACHE PO) [id number] Yes Take by mouth. [provider] Taking Active   atorvastatin (LIPITOR) 10 MG tablet [id number] Yes Take 1 tablet (10 mg total) by mouth daily. Donell Beers, FNP Taking Active   Blood Glucose Monitoring Suppl (TRUE METRIX METER) w/Device KIT [id number]  1 kit by Does not apply route in the  morning, at noon, in the evening, and at bedtime. Barbette Merino, NP  Active   empagliflozin (JARDIANCE) 10 MG TABS tablet [id number] No Take 1 tablet (10 mg total) by mouth daily before breakfast.  Patient not taking: Reported on 01/01/2023   Donell Beers, FNP Not Taking Active   Ferrous Fumarate (HEMOCYTE - 106 MG FE)  324 (106 Fe) MG TABS tablet [id number] Yes Take 1 tablet (106 mg of iron total) by mouth daily. Barbette Merino, NP Taking Active            Med Note Alden Hipp   Tue Jan 01, 2023 10:04 AM)    fluticasone Aleda Grana) 50 MCG/ACT nasal spray [id number]  Place 2 sprays into both nostrils daily. Donell Beers, FNP  Active   glucose blood (TRUE METRIX BLOOD GLUCOSE TEST) test strip [id number] Yes Use as instructed Barbette Merino, NP Taking Active   hydrochlorothiazide (HYDRODIURIL) 25 MG tablet [id number] Yes Take 1 tablet (25 mg total) by mouth once daily. Ivonne Andrew, NP Taking Active   ibuprofen (ADVIL) 600 MG tablet [id number]  Take 1 tablet (600 mg total) by mouth 2 (two) times daily as needed. Donell Beers, FNP  Active   losartan (COZAAR) 25 MG tablet [id number] Yes Take 1 tablet (25 mg total) by mouth daily. Donell Beers, FNP Taking Active   metFORMIN (GLUCOPHAGE) 1000 MG tablet [id number] Yes Take 1 tablet (1,000 mg total) by mouth 2 (two) times daily with a meal. Paseda, Baird Kay, FNP Taking Active   ondansetron (ZOFRAN) 4 MG tablet [id number] No Take 1 tablet (4 mg total) by mouth every 8 (eight) hours as needed for nausea or vomiting.  Patient not taking: Reported on 01/01/2023   Donell Beers, FNP Not Taking Active   TRUEplus Lancets 28G MISC [id number]  use as directed in the morning, at noon, in the evening, and at bedtime. Barbette Merino, NP  Active               Assessment/Plan:   Diabetes: - Currently uncontrolled but improved - Reviewed long term cardiovascular and renal outcomes of uncontrolled blood sugar - Reviewed goal A1c, goal fasting, and goal 2 hour post prandial glucose - Reviewed dietary modifications including: focus on lean proteins, fruits and vegetables, whole grains. Decrease soda/juice consumption - Reviewed lifestyle modifications including: increase physical activity as able - Recommend to continue metformin 1000 mg twice  daily. Anticipate improvement in A1c with metformin + nutritional and lifestyle modification.  - Recommend to check glucose twice daily, fasting and 2 hour post prandial  Hypertension: - Currently controlled - Reviewed long term cardiovascular and renal outcomes of uncontrolled blood pressure - Reviewed appropriate blood pressure monitoring technique and reviewed goal blood pressure. Recommended to check home blood pressure and heart rate periodically - Recommend to continue current regimen at this time  Hyperlipidemia/ASCVD Risk Reduction: - Currently uncontrolled but anticipate improvement - Reviewed long term complications of uncontrolled cholesterol - Reviewed dietary recommendations including: reduction in fatty foods as above - Recommend to continue current regimen. Schedule follow up for lipids.   Follow Up Plan: phone cal in 4-6 weeks; patient agrees to call the office to schedule PCP visit  Catie Eppie Gibson, PharmD, BCACP, CPP [facility] [phone]

## 2023-01-14 ENCOUNTER — Other Ambulatory Visit: Payer: Self-pay

## 2023-01-14 ENCOUNTER — Other Ambulatory Visit: Payer: Self-pay | Admitting: Nurse Practitioner

## 2023-01-14 DIAGNOSIS — I10 Essential (primary) hypertension: Secondary | ICD-10-CM

## 2023-01-14 DIAGNOSIS — E785 Hyperlipidemia, unspecified: Secondary | ICD-10-CM

## 2023-01-15 ENCOUNTER — Other Ambulatory Visit: Payer: Self-pay

## 2023-01-15 MED ORDER — ATORVASTATIN CALCIUM 10 MG PO TABS
10.0000 mg | ORAL_TABLET | Freq: Every day | ORAL | 0 refills | Status: DC
Start: 2023-01-15 — End: 2023-02-12
  Filled 2023-01-15: qty 90, 90d supply, fill #0

## 2023-01-15 MED ORDER — LOSARTAN POTASSIUM 25 MG PO TABS
25.0000 mg | ORAL_TABLET | Freq: Every day | ORAL | 0 refills | Status: DC
Start: 2023-01-15 — End: 2023-02-12
  Filled 2023-01-15: qty 90, 90d supply, fill #0

## 2023-02-12 ENCOUNTER — Other Ambulatory Visit: Payer: BLUE CROSS/BLUE SHIELD | Admitting: Pharmacist

## 2023-02-12 ENCOUNTER — Other Ambulatory Visit: Payer: Self-pay

## 2023-02-12 ENCOUNTER — Other Ambulatory Visit (HOSPITAL_COMMUNITY): Payer: Self-pay

## 2023-02-12 ENCOUNTER — Other Ambulatory Visit: Payer: Self-pay | Admitting: Nurse Practitioner

## 2023-02-12 DIAGNOSIS — I10 Essential (primary) hypertension: Secondary | ICD-10-CM

## 2023-02-12 DIAGNOSIS — E785 Hyperlipidemia, unspecified: Secondary | ICD-10-CM

## 2023-02-12 MED ORDER — LOSARTAN POTASSIUM 25 MG PO TABS
25.0000 mg | ORAL_TABLET | Freq: Every day | ORAL | 0 refills | Status: DC
Start: 2023-02-12 — End: 2023-10-13
  Filled 2023-02-12: qty 90, 90d supply, fill #0
  Filled 2023-02-25 – 2023-06-20 (×2): qty 30, 30d supply, fill #0
  Filled 2023-07-25: qty 30, 30d supply, fill #1
  Filled 2023-08-30: qty 30, 30d supply, fill #2

## 2023-02-12 MED ORDER — ATORVASTATIN CALCIUM 10 MG PO TABS
10.00 mg | ORAL_TABLET | Freq: Every day | ORAL | 0 refills | Status: DC
Start: 2023-02-12 — End: 2023-02-23
  Filled 2023-02-12: qty 90, 90d supply, fill #0

## 2023-02-12 NOTE — Patient Instructions (Signed)
Susan Mathis,   Call the office to schedule follow up with [facility]. You are due for a few labs- A1c and cholesterol being the big ones.   Continue your current regimen.   Check your blood sugars twice daily:  1) Fasting, first thing in the morning before breakfast and  2) 2 hours after your largest meal.   For a goal A1c of less than 7%, goal fasting readings are less than 130 and goal 2 hour after meal readings are less than 180.    Check your blood pressure once weekly, and any time you have concerning symptoms like headache, chest pain, dizziness, shortness of breath, or vision changes.   Our goal is less than 130/80.  To appropriately check your blood pressure, make sure you do the following:  1) Avoid caffeine, exercise, or tobacco products for 30 minutes before checking. Empty your bladder. 2) Sit with your back supported in a flat-backed chair. Rest your arm on something flat (arm of the chair, table, etc). 3) Sit still with your feet flat on the floor, resting, for at least 5 minutes.  4) Check your blood pressure. Take 1-2 readings.  5) Write down these readings and bring with you to any provider appointments.  Bring your home blood pressure machine with you to a provider's office for accuracy comparison at least once a year.   Make sure you take your blood pressure medications before you come to any office visit, even if you were asked to fast for labs.   Take care!  Catie Eppie Gibson, PharmD, BCACP, CPP Clinical Pharmacist [facility] [phone]

## 2023-02-12 NOTE — Progress Notes (Signed)
02/12/2023 Name: Susan Mathis MRN: [id number] DOB: 12-05-1970  Chief Complaint  Patient presents with   Medication Management   Diabetes   Hypertension    Amos Fuhr is a 52 y.o. year old female who presented for a telephone visit.   They were referred to the pharmacist by their PCP for assistance in managing diabetes, hypertension, and hyperlipidemia.    Subjective:  Care Team: Primary Care Provider: Donell Beers, FNP ; Next Scheduled Visit: not scheduled  Medication Access/Adherence  Current Pharmacy:  [facility] - [facility] [address], [address] [place] [address] Phone: [phone] Fax: [phone]   Patient reports affordability concerns with their medications: No  Patient reports access/transportation concerns to their pharmacy: No  Patient reports adherence concerns with their medications:  No    Also reports she thinks she needs to see someone about fibroids.   Diabetes:  Current medications: metformin 1000 mg twice daily  Current glucose readings: just moved, has not had a chance to check any blood sugar readings lately.   Hypertension:  Current medications: amlodipine 10 mg daily, HCTZ 25 mg daily, losartan 25 mg daily   Patient has a validated, automated, upper arm home BP cuff Current blood pressure readings readings: remember a reading of 146/80, but notes she had just been exercising  Patient denies hypotensive s/sx including dizziness, lightheadedness.  Patient denies hypertensive symptoms including headache, chest pain, shortness of breath  Hyperlipidemia/ASCVD Risk Reduction  Current lipid lowering medications: atorvastatin 10 mg daily   Objective:  Lab Results  Component Value Date   HGBA1C 8.9 (A) 07/24/2022    Lab Results  Component Value Date   CREATININE 0.60 08/16/2022   BUN 12 08/16/2022   NA 139 08/16/2022   K 3.7 08/16/2022   CL 99 08/16/2022   CO2 23  08/16/2022    Lab Results  Component Value Date   CHOL 212 (H) 08/16/2022   HDL 48 08/16/2022   LDLCALC 151 (H) 08/16/2022   TRIG 72 08/16/2022   CHOLHDL 4.4 08/16/2022    Medications Reviewed Today     Reviewed by Alden Hipp, RPH-CPP (Pharmacist) on 01/01/23 at 1006  Med List Status: <None>   Medication Order Taking? Sig Documenting Provider Last Dose Status Informant  amLODipine (NORVASC) 10 MG tablet [id number] Yes Take 1 tablet (10 mg total) by mouth once daily. Ivonne Andrew, NP Taking Active   aspirin EC 81 MG tablet [id number] Yes Take 81 mg by mouth daily. Swallow whole. [provider] Taking Active   Aspirin-Acetaminophen-Caffeine (GOODY HEADACHE PO) [id number] Yes Take by mouth. [provider] Taking Active   atorvastatin (LIPITOR) 10 MG tablet [id number] Yes Take 1 tablet (10 mg total) by mouth daily. Donell Beers, FNP Taking Active   Blood Glucose Monitoring Suppl (TRUE METRIX METER) w/Device KIT [id number]  1 kit by Does not apply route in the morning, at noon, in the evening, and at bedtime. Barbette Merino, NP  Active   empagliflozin (JARDIANCE) 10 MG TABS tablet [id number] No Take 1 tablet (10 mg total) by mouth daily before breakfast.  Patient not taking: Reported on 01/01/2023   Donell Beers, FNP Not Taking Active   Ferrous Fumarate (HEMOCYTE - 106 MG FE) 324 (106 Fe) MG TABS tablet [id number] Yes Take 1 tablet (106 mg of iron total) by mouth daily. Barbette Merino, NP Taking Active            Med Note (  Alden Hipp   Tue Jan 01, 2023 10:04 AM)    fluticasone (FLONASE) 50 MCG/ACT nasal spray [id number]  Place 2 sprays into both nostrils daily. Donell Beers, FNP  Active   glucose blood (TRUE METRIX BLOOD GLUCOSE TEST) test strip [id number] Yes Use as instructed Barbette Merino, NP Taking Active   hydrochlorothiazide (HYDRODIURIL) 25 MG tablet [id number] Yes Take 1 tablet (25 mg total) by mouth once daily. Ivonne Andrew, NP Taking Active   ibuprofen (ADVIL) 600 MG tablet [id number]  Take 1 tablet (600 mg total) by mouth 2 (two) times daily as needed. Donell Beers, FNP  Active   losartan (COZAAR) 25 MG tablet [id number] Yes Take 1 tablet (25 mg total) by mouth daily. Donell Beers, FNP Taking Active   metFORMIN (GLUCOPHAGE) 1000 MG tablet [id number] Yes Take 1 tablet (1,000 mg total) by mouth 2 (two) times daily with a meal. Paseda, Baird Kay, FNP Taking Active   ondansetron (ZOFRAN) 4 MG tablet [id number] No Take 1 tablet (4 mg total) by mouth every 8 (eight) hours as needed for nausea or vomiting.  Patient not taking: Reported on 01/01/2023   Donell Beers, FNP Not Taking Active   TRUEplus Lancets 28G MISC [id number]  use as directed in the morning, at noon, in the evening, and at bedtime. Barbette Merino, NP  Active               Assessment/Plan:   Diabetes: - Currently uncontrolled, due for labs - Reviewed long term cardiovascular and renal outcomes of uncontrolled blood sugar - Reviewed goal A1c, goal fasting, and goal 2 hour post prandial glucose - Recommend to continue current regimen. Schedule with PCP.   Hypertension: - Currently uncontrolled per single home reading, but BP was at goal at last viist - Reviewed long term cardiovascular and renal outcomes of uncontrolled blood pressure - Reviewed appropriate blood pressure monitoring technique and reviewed goal blood pressure. Recommended to check home blood pressure and heart rate periodically, document, and provide at future appointments - Recommend to continue current regimen at this time  Hyperlipidemia/ASCVD Risk Reduction: - Currently uncontrolled but due for updated lipid panel after starting atorvastatin  - Recommend to recheck lipids with next appointment  Follow Up Plan: offered to assist patient in scheduling appointment with PCP; she declined, noted she would call the office later this week to schedule  Catie  Eppie Gibson, PharmD, BCACP, CPP Clinical Pharmacist [facility] [phone]

## 2023-02-13 ENCOUNTER — Other Ambulatory Visit: Payer: Self-pay

## 2023-02-13 ENCOUNTER — Other Ambulatory Visit (HOSPITAL_COMMUNITY): Payer: Self-pay

## 2023-02-14 ENCOUNTER — Other Ambulatory Visit: Payer: Self-pay

## 2023-02-18 ENCOUNTER — Other Ambulatory Visit: Payer: Self-pay

## 2023-02-22 ENCOUNTER — Encounter: Payer: Self-pay | Admitting: Nurse Practitioner

## 2023-02-22 ENCOUNTER — Other Ambulatory Visit: Payer: Self-pay

## 2023-02-22 ENCOUNTER — Ambulatory Visit (INDEPENDENT_AMBULATORY_CARE_PROVIDER_SITE_OTHER): Payer: Medicaid Other | Admitting: Nurse Practitioner

## 2023-02-22 VITALS — BP 130/89 | HR 78 | Ht 66.0 in | Wt 213.4 lb

## 2023-02-22 DIAGNOSIS — E1169 Type 2 diabetes mellitus with other specified complication: Secondary | ICD-10-CM | POA: Diagnosis not present

## 2023-02-22 DIAGNOSIS — E669 Obesity, unspecified: Secondary | ICD-10-CM

## 2023-02-22 DIAGNOSIS — D259 Leiomyoma of uterus, unspecified: Secondary | ICD-10-CM | POA: Diagnosis not present

## 2023-02-22 DIAGNOSIS — E785 Hyperlipidemia, unspecified: Secondary | ICD-10-CM | POA: Diagnosis not present

## 2023-02-22 DIAGNOSIS — E1165 Type 2 diabetes mellitus with hyperglycemia: Secondary | ICD-10-CM

## 2023-02-22 DIAGNOSIS — I10 Essential (primary) hypertension: Secondary | ICD-10-CM | POA: Diagnosis not present

## 2023-02-22 DIAGNOSIS — Z1329 Encounter for screening for other suspected endocrine disorder: Secondary | ICD-10-CM | POA: Diagnosis not present

## 2023-02-22 LAB — POCT GLYCOSYLATED HEMOGLOBIN (HGB A1C): Hemoglobin A1C: 9.3 % — AB (ref 4.0–5.6)

## 2023-02-22 MED ORDER — GLIPIZIDE 5 MG PO TABS
5.0000 mg | ORAL_TABLET | Freq: Two times a day (BID) | ORAL | 3 refills | Status: DC
Start: 2023-02-22 — End: 2023-09-25
  Filled 2023-02-22: qty 60, 30d supply, fill #0

## 2023-02-22 MED ORDER — AMLODIPINE BESYLATE 10 MG PO TABS
10.0000 mg | ORAL_TABLET | Freq: Every day | ORAL | 3 refills | Status: DC
Start: 2023-02-22 — End: 2023-12-23
  Filled 2023-02-22 – 2023-03-01 (×4): qty 30, 30d supply, fill #0
  Filled 2023-04-12: qty 30, 30d supply, fill #1
  Filled 2023-05-07 – 2023-05-24 (×2): qty 30, 30d supply, fill #2
  Filled 2023-06-20: qty 30, 30d supply, fill #3
  Filled 2023-07-25: qty 30, 30d supply, fill #4
  Filled 2023-08-30: qty 30, 30d supply, fill #5
  Filled 2023-10-13 – 2023-10-25 (×2): qty 30, 30d supply, fill #6
  Filled 2023-11-17 – 2023-11-28 (×2): qty 30, 30d supply, fill #7

## 2023-02-22 NOTE — Assessment & Plan Note (Signed)
Lab Results  Component Value Date   HGBA1C 9.3 (A) 02/22/2023  Diabetes remains uncontrolled Patient encouraged to start taking metformin 1000 mg twice daily start blood send 5 mg twice daily Patient counseled on low-carb modified diet CBG goals discussed Need to get her diabetes under control discussed Encouraged to call the office for low blood sugars consistently less than 70 Follow-up in 3 months

## 2023-02-22 NOTE — Progress Notes (Signed)
Established Patient Office Visit  Subjective:  Patient ID: Susan Mathis, female    DOB: September 07, 1970  Age: 52 y.o. MRN: [id number]  CC:  Chief Complaint  Patient presents with   Follow-up    HPI Susan Mathis is a 52 y.o. female  has a past medical history of Diabetes mellitus without complication (HCC), Fibroids, Fibroids, Hyperlipidemia, Hypertension, and Migraines.  Patient presents for follow-up for her chronic medical conditions.  She stated that she has been busy taking care of her mother and has not been able to follow-up in the office regularly.   Hypertension.  Currently on amlodipine 10 mg daily, hydrochlorothiazide 25 mg daily, losartan 25 mg daily.  Today that she ran out of amlodipine some days ago.  She denies shortness of breath, dizziness, edema.  She reports blood pressure readings of 125/82 at home.   Type 2 diabetes.  Currently on metformin 1000 mg daily.  She reports polyuria, polydipsia, polyphagia.  Was on Jardiance in the past but does not want to take Jardiance, she does not want injectable diabetic medication.  Reports that her diet is not good, drinks juice on some days.    .   Past Medical History:  Diagnosis Date   Diabetes mellitus without complication (HCC)    Fibroids    Fibroids    Hyperlipidemia    Hypertension    Migraines     Past Surgical History:  Procedure Laterality Date   CESAREAN SECTION      Family History  Problem Relation Age of Onset   Hypertension Mother    Hypertension Father     Social History   Socioeconomic History   Marital status: Single    Spouse name: Not on file   Number of children: Not on file   Years of education: Not on file   Highest education level: Not on file  Occupational History   Not on file  Tobacco Use   Smoking status: Never   Smokeless tobacco: Never  Vaping Use   Vaping Use: Never used  Substance and Sexual Activity   Alcohol use: Not Currently   Drug use: Never   Sexual activity:  Not on file  Other Topics Concern   Not on file  Social History Narrative   Not on file   Social Determinants of Health   Financial Resource Strain: Not on file  Food Insecurity: Not on file  Transportation Needs: Not on file  Physical Activity: Not on file  Stress: Not on file  Social Connections: Not on file  Intimate Partner Violence: Not on file    Outpatient Medications Prior to Visit  Medication Sig Dispense Refill   aspirin EC 81 MG tablet Take 81 mg by mouth daily. Swallow whole.     atorvastatin (LIPITOR) 10 MG tablet Take 1 tablet (10 mg total) by mouth daily. 90 tablet 0   Ferrous Fumarate (HEMOCYTE - 106 MG FE) 324 (106 Fe) MG TABS tablet Take 1 tablet (106 mg of iron total) by mouth daily. 90 tablet 0   fluticasone (FLONASE) 50 MCG/ACT nasal spray Place 2 sprays into both nostrils daily. 16 g 6   hydrochlorothiazide (HYDRODIURIL) 25 MG tablet Take 1 tablet (25 mg total) by mouth once daily. 90 tablet 3   ibuprofen (ADVIL) 600 MG tablet Take 1 tablet (600 mg total) by mouth 2 (two) times daily as needed. 30 tablet 0   losartan (COZAAR) 25 MG tablet Take 1 tablet (25 mg total) by mouth daily. 90  tablet 0   metFORMIN (GLUCOPHAGE) 1000 MG tablet Take 1 tablet (1,000 mg total) by mouth 2 (two) times daily with a meal. 180 tablet 3   Multiple Vitamin (MULTIVITAMIN) capsule Take 1 capsule by mouth daily.     amLODipine (NORVASC) 10 MG tablet Take 1 tablet (10 mg total) by mouth once daily. 90 tablet 3   Aspirin-Acetaminophen-Caffeine (GOODY HEADACHE PO) Take by mouth. (Patient not taking: Reported on 02/12/2023)     Blood Glucose Monitoring Suppl (TRUE METRIX METER) w/Device KIT 1 kit by Does not apply route in the morning, at noon, in the evening, and at bedtime. (Patient not taking: Reported on 02/22/2023) 1 kit 0   glucose blood (TRUE METRIX BLOOD GLUCOSE TEST) test strip Use as instructed (Patient not taking: Reported on 02/22/2023) 100 each 12   ondansetron (ZOFRAN) 4 MG tablet  Take 1 tablet (4 mg total) by mouth every 8 (eight) hours as needed for nausea or vomiting. (Patient not taking: Reported on 01/01/2023) 20 tablet 0   TRUEplus Lancets 28G MISC use as directed in the morning, at noon, in the evening, and at bedtime. (Patient not taking: Reported on 02/22/2023) 210 each 11   No facility-administered medications prior to visit.    No Known Allergies  ROS Review of Systems  Constitutional:  Negative for activity change, appetite change, chills, fatigue and fever.  HENT:  Negative for congestion, dental problem, ear discharge, ear pain, hearing loss, rhinorrhea, sinus pressure, sinus pain, sneezing and sore throat.   Eyes:  Negative for pain, discharge, redness and itching.  Respiratory:  Negative for cough, chest tightness, shortness of breath and wheezing.   Cardiovascular:  Negative for chest pain, palpitations and leg swelling.  Gastrointestinal:  Negative for abdominal distention, abdominal pain, anal bleeding, blood in stool, constipation, diarrhea, nausea, rectal pain and vomiting.  Endocrine: Negative for cold intolerance, heat intolerance, polydipsia, polyphagia and polyuria.  Genitourinary:  Negative for difficulty urinating, dysuria, flank pain, frequency, hematuria, menstrual problem, pelvic pain and vaginal bleeding.  Musculoskeletal:  Negative for arthralgias, back pain, gait problem, joint swelling and myalgias.  Skin:  Negative for color change, pallor, rash and wound.  Allergic/Immunologic: Negative for environmental allergies, food allergies and immunocompromised state.  Neurological:  Negative for dizziness, tremors, facial asymmetry, weakness and headaches.  Hematological:  Negative for adenopathy. Does not bruise/bleed easily.  Psychiatric/Behavioral:  Negative for agitation, behavioral problems, confusion, decreased concentration, hallucinations, self-injury and suicidal ideas.       Objective:    Physical Exam Vitals and nursing note  reviewed.  Constitutional:      General: She is not in acute distress.    Appearance: Normal appearance. She is obese. She is not ill-appearing, toxic-appearing or diaphoretic.  HENT:     Mouth/Throat:     Mouth: Mucous membranes are moist.     Pharynx: Oropharynx is clear. No oropharyngeal exudate or posterior oropharyngeal erythema.  Eyes:     General: No scleral icterus.       Right eye: No discharge.        Left eye: No discharge.     Extraocular Movements: Extraocular movements intact.     Conjunctiva/sclera: Conjunctivae normal.  Cardiovascular:     Rate and Rhythm: Normal rate and regular rhythm.     Pulses: Normal pulses.     Heart sounds: Normal heart sounds. No murmur heard.    No friction rub. No gallop.  Pulmonary:     Effort: Pulmonary effort is normal. No respiratory  distress.     Breath sounds: Normal breath sounds. No stridor. No wheezing, rhonchi or rales.  Chest:     Chest wall: No tenderness.  Abdominal:     General: There is no distension.     Palpations: Abdomen is soft.     Tenderness: There is no abdominal tenderness. There is no right CVA tenderness, left CVA tenderness or guarding.  Musculoskeletal:        General: No swelling, tenderness, deformity or signs of injury.     Right lower leg: No edema.     Left lower leg: No edema.  Skin:    General: Skin is warm and dry.     Capillary Refill: Capillary refill takes less than 2 seconds.     Coloration: Skin is not jaundiced or pale.     Findings: No bruising, erythema or lesion.  Neurological:     Mental Status: She is alert and oriented to person, place, and time.     Motor: No weakness.     Coordination: Coordination normal.     Gait: Gait normal.  Psychiatric:        Mood and Affect: Mood normal.        Behavior: Behavior normal.        Thought Content: Thought content normal.        Judgment: Judgment normal.     BP 130/89   Pulse 78   Ht 5\' 6"  (1.676 m)   Wt 213 lb 6.4 oz (96.8 kg)    SpO2 99%   BMI 34.44 kg/m  Wt Readings from Last 3 Encounters:  02/22/23 213 lb 6.4 oz (96.8 kg)  08/21/22 217 lb (98.4 kg)  07/24/22 215 lb (97.5 kg)    Lab Results  Component Value Date   TSH 1.600 03/24/2020   Lab Results  Component Value Date   WBC 7.4 03/24/2020   HGB 13.8 03/24/2020   HCT 42.8 03/24/2020   MCV 84 03/24/2020   PLT 286 03/24/2020   Lab Results  Component Value Date   NA 139 08/16/2022   K 3.7 08/16/2022   CO2 23 08/16/2022   GLUCOSE 188 (H) 08/16/2022   BUN 12 08/16/2022   CREATININE 0.60 08/16/2022   BILITOT 0.2 07/24/2022   ALKPHOS 82 07/24/2022   AST 18 07/24/2022   ALT 18 07/24/2022   PROT 7.7 07/24/2022   ALBUMIN 4.9 07/24/2022   CALCIUM 9.8 08/16/2022   ANIONGAP 12 03/12/2020   EGFR 108 08/16/2022   Lab Results  Component Value Date   CHOL 212 (H) 08/16/2022   Lab Results  Component Value Date   HDL 48 08/16/2022   Lab Results  Component Value Date   LDLCALC 151 (H) 08/16/2022   Lab Results  Component Value Date   TRIG 72 08/16/2022   Lab Results  Component Value Date   CHOLHDL 4.4 08/16/2022   Lab Results  Component Value Date   HGBA1C 9.3 (A) 02/22/2023      Assessment & Plan:   Problem List Items Addressed This Visit       Cardiovascular and Mediastinum   Essential hypertension (Chronic)    BP Readings from Last 3 Encounters:  02/22/23 130/89  08/21/22 134/87  07/24/22 (!) 145/101  Blood pressure slightly elevated today she has been out of amlodipine Continue amlodipine 10 mg daily, hydrochlorothiazide 25 mg daily, losartan 25 mg daily Continue current medications. No changes in management. Discussed DASH diet and dietary sodium restrictions Continue to increase dietary  efforts and exercise.  Checking CMP        Relevant Medications   amLODipine (NORVASC) 10 MG tablet     Endocrine   Hyperlipidemia associated with type 2 diabetes mellitus (HCC) (Chronic)    LDL goal is less than 70 Currently on  atorvastatin 10 mg daily Check a fasting lipid panel today      Relevant Medications   glipiZIDE (GLUCOTROL) 5 MG tablet   amLODipine (NORVASC) 10 MG tablet   Uncontrolled type 2 diabetes mellitus with hyperglycemia (HCC) - Primary    Lab Results  Component Value Date   HGBA1C 9.3 (A) 02/22/2023  Diabetes remains uncontrolled Patient encouraged to start taking metformin 1000 mg twice daily start blood send 5 mg twice daily Patient counseled on low-carb modified diet CBG goals discussed Need to get her diabetes under control discussed Encouraged to call the office for low blood sugars consistently less than 70 Follow-up in 3 months      Relevant Medications   glipiZIDE (GLUCOTROL) 5 MG tablet   Other Relevant Orders   POCT glycosylated hemoglobin (Hb A1C) (Completed)   CMP14+EGFR   Lipid panel     Genitourinary   Fibroid, uterine    She reports abdominal fullness and discomfort sometimes She would like surgery to remove her fibroids Patient referred to OB/GYN      Relevant Orders   Ambulatory referral to Gynecology     Other   Obesity (BMI 30-39.9)    Wt Readings from Last 3 Encounters:  02/22/23 213 lb 6.4 oz (96.8 kg)  08/21/22 217 lb (98.4 kg)  07/24/22 215 lb (97.5 kg)   Body mass index is 34.44 kg/m.  Patient counseled on low-carb modified diet Encouraged to engage in regular moderate to vigorous exercise at least 150 minutes weekly      Relevant Medications   glipiZIDE (GLUCOTROL) 5 MG tablet   Screening for thyroid disorder   Relevant Orders   TSH   Other Visit Diagnoses     Hypertension, unspecified type       Relevant Medications   amLODipine (NORVASC) 10 MG tablet   Other Relevant Orders   CMP14+EGFR   TSH       Meds ordered this encounter  Medications   glipiZIDE (GLUCOTROL) 5 MG tablet    Sig: Take 1 tablet (5 mg total) by mouth 2 (two) times daily before a meal.    Dispense:  60 tablet    Refill:  3   amLODipine (NORVASC) 10 MG  tablet    Sig: Take 1 tablet (10 mg total) by mouth once daily.    Dispense:  90 tablet    Refill:  3    Follow-up: Return in about 3 months (around 05/25/2023) for HTN, HYPERLIPIDEMIA.    Donell Beers, FNP

## 2023-02-22 NOTE — Assessment & Plan Note (Signed)
She reports abdominal fullness and discomfort sometimes She would like surgery to remove her fibroids Patient referred to OB/GYN

## 2023-02-22 NOTE — Assessment & Plan Note (Signed)
LDL goal is less than 70 Currently on atorvastatin 10 mg daily Check a fasting lipid panel today

## 2023-02-22 NOTE — Assessment & Plan Note (Signed)
Wt Readings from Last 3 Encounters:  02/22/23 213 lb 6.4 oz (96.8 kg)  08/21/22 217 lb (98.4 kg)  07/24/22 215 lb (97.5 kg)   Body mass index is 34.44 kg/m.  Patient counseled on low-carb modified diet Encouraged to engage in regular moderate to vigorous exercise at least 150 minutes weekly

## 2023-02-22 NOTE — Assessment & Plan Note (Signed)
BP Readings from Last 3 Encounters:  02/22/23 130/89  08/21/22 134/87  07/24/22 (!) 145/101  Blood pressure slightly elevated today she has been out of amlodipine Continue amlodipine 10 mg daily, hydrochlorothiazide 25 mg daily, losartan 25 mg daily Continue current medications. No changes in management. Discussed DASH diet and dietary sodium restrictions Continue to increase dietary efforts and exercise.  Checking CMP

## 2023-02-22 NOTE — Patient Instructions (Addendum)
Goal for fasting blood sugar ranges from 80 to 120 and 2 hours after any meal or at bedtime should be between 130 to 170.     1. Uncontrolled type 2 diabetes mellitus with hyperglycemia (HCC)  - POCT glycosylated hemoglobin (Hb A1C) - glipiZIDE (GLUCOTROL) 5 MG tablet; Take 1 tablet (5 mg total) by mouth 2 (two) times daily before a meal.  Dispense: 60 tablet; Refill: 3  2. Fibroids  - Ambulatory referral to Gynecology  3. Hypertension, unspecified type  - amLODipine (NORVASC) 10 MG tablet; Take 1 tablet (10 mg total) by mouth once daily.  Dispense: 90 tablet; Refill: 3      It is important that you exercise regularly at least 30 minutes 5 times a week as tolerated  Think about what you will eat, plan ahead. Choose " clean, green, fresh or frozen" over canned, processed or packaged foods which are more sugary, salty and fatty. 70 to 75% of food eaten should be vegetables and fruit. Three meals at set times with snacks allowed between meals, but they must be fruit or vegetables. Aim to eat over a 12 hour period , example 7 am to 7 pm, and STOP after  your last meal of the day. Drink water,generally about 64 ounces per day, no other drink is as healthy. Fruit juice is best enjoyed in a healthy way, by EATING the fruit.  Thanks for choosing [facility] we consider it a privelige to serve you.

## 2023-02-23 ENCOUNTER — Other Ambulatory Visit: Payer: Self-pay | Admitting: Nurse Practitioner

## 2023-02-23 DIAGNOSIS — E785 Hyperlipidemia, unspecified: Secondary | ICD-10-CM

## 2023-02-23 LAB — CMP14+EGFR
ALT: 16 IU/L (ref 0–32)
AST: 13 IU/L (ref 0–40)
Albumin: 4.1 g/dL (ref 3.8–4.9)
Alkaline Phosphatase: 67 IU/L (ref 44–121)
BUN/Creatinine Ratio: 19 (ref 9–23)
BUN: 10 mg/dL (ref 6–24)
Bilirubin Total: 0.3 mg/dL (ref 0.0–1.2)
CO2: 20 mmol/L (ref 20–29)
Calcium: 9.1 mg/dL (ref 8.7–10.2)
Chloride: 100 mmol/L (ref 96–106)
Creatinine, Ser: 0.53 mg/dL — ABNORMAL LOW (ref 0.57–1.00)
Globulin, Total: 3 g/dL (ref 1.5–4.5)
Glucose: 211 mg/dL — ABNORMAL HIGH (ref 70–99)
Potassium: 4.2 mmol/L (ref 3.5–5.2)
Sodium: 137 mmol/L (ref 134–144)
Total Protein: 7.1 g/dL (ref 6.0–8.5)
eGFR: 111 mL/min/{1.73_m2} (ref >59)

## 2023-02-23 LAB — LIPID PANEL
Chol/HDL Ratio: 3.4 ratio (ref 0.0–4.4)
Cholesterol, Total: 151 mg/dL (ref 100–199)
HDL: 45 mg/dL (ref >39)
LDL Chol Calc (NIH): 92 mg/dL (ref 0–99)
Triglycerides: 69 mg/dL (ref 0–149)
VLDL Cholesterol Cal: 14 mg/dL (ref 5–40)

## 2023-02-23 LAB — TSH: TSH: 1.33 u[IU]/mL (ref 0.450–4.500)

## 2023-02-23 MED ORDER — ATORVASTATIN CALCIUM 20 MG PO TABS
20.0000 mg | ORAL_TABLET | Freq: Every day | ORAL | 1 refills | Status: DC
Start: 2023-02-23 — End: 2023-09-25
  Filled 2023-02-23 – 2023-06-20 (×2): qty 30, 30d supply, fill #0

## 2023-02-25 ENCOUNTER — Telehealth: Payer: Self-pay

## 2023-02-25 ENCOUNTER — Other Ambulatory Visit (HOSPITAL_COMMUNITY): Payer: Self-pay

## 2023-02-25 ENCOUNTER — Other Ambulatory Visit: Payer: Self-pay

## 2023-02-25 NOTE — Telephone Encounter (Signed)
Chart review completed for patient. Patient is due for screening mammogram. Mychart message sent to patient to inquire about scheduling mammogram.  Ann Groeneveld, Population Health Specialist.  

## 2023-02-26 ENCOUNTER — Other Ambulatory Visit: Payer: Self-pay

## 2023-02-26 ENCOUNTER — Encounter: Payer: Self-pay | Admitting: Pharmacist

## 2023-02-27 ENCOUNTER — Other Ambulatory Visit: Payer: Self-pay

## 2023-02-28 ENCOUNTER — Other Ambulatory Visit (HOSPITAL_COMMUNITY): Payer: Self-pay

## 2023-02-28 ENCOUNTER — Other Ambulatory Visit: Payer: Self-pay

## 2023-02-28 ENCOUNTER — Other Ambulatory Visit: Payer: Self-pay | Admitting: Nurse Practitioner

## 2023-02-28 DIAGNOSIS — I10 Essential (primary) hypertension: Secondary | ICD-10-CM

## 2023-02-28 MED ORDER — HYDROCHLOROTHIAZIDE 25 MG PO TABS
25.0000 mg | ORAL_TABLET | Freq: Every day | ORAL | 3 refills | Status: DC
Start: 2023-02-28 — End: 2023-12-23
  Filled 2023-02-28 – 2023-03-01 (×2): qty 30, 30d supply, fill #0
  Filled 2023-04-12: qty 30, 30d supply, fill #1
  Filled 2023-05-07 – 2023-05-24 (×2): qty 30, 30d supply, fill #2
  Filled 2023-06-20: qty 30, 30d supply, fill #3
  Filled 2023-07-25: qty 30, 30d supply, fill #4
  Filled 2023-08-30: qty 30, 30d supply, fill #5
  Filled 2023-10-13 – 2023-10-25 (×2): qty 30, 30d supply, fill #6
  Filled 2023-11-17 – 2023-11-28 (×2): qty 30, 30d supply, fill #7

## 2023-03-01 ENCOUNTER — Other Ambulatory Visit: Payer: Self-pay

## 2023-03-01 ENCOUNTER — Other Ambulatory Visit (HOSPITAL_COMMUNITY): Payer: Self-pay

## 2023-03-04 ENCOUNTER — Other Ambulatory Visit: Payer: Self-pay

## 2023-03-26 ENCOUNTER — Inpatient Hospital Stay: Admission: RE | Admit: 2023-03-26 | Payer: Medicaid Other | Source: Ambulatory Visit

## 2023-04-12 ENCOUNTER — Other Ambulatory Visit: Payer: Self-pay | Admitting: Nurse Practitioner

## 2023-04-12 ENCOUNTER — Other Ambulatory Visit: Payer: Self-pay

## 2023-04-12 MED ORDER — IBUPROFEN 600 MG PO TABS
600.0000 mg | ORAL_TABLET | Freq: Two times a day (BID) | ORAL | 0 refills | Status: DC | PRN
  Filled 2023-04-12 – 2023-04-23 (×2): qty 30, 15d supply, fill #0

## 2023-04-19 ENCOUNTER — Other Ambulatory Visit: Payer: Self-pay

## 2023-04-23 ENCOUNTER — Other Ambulatory Visit: Payer: Self-pay

## 2023-05-14 ENCOUNTER — Other Ambulatory Visit: Payer: Self-pay

## 2023-05-24 ENCOUNTER — Other Ambulatory Visit: Payer: Self-pay

## 2023-06-12 DIAGNOSIS — Z6835 Body mass index (BMI) 35.0-35.9, adult: Secondary | ICD-10-CM | POA: Diagnosis not present

## 2023-06-12 DIAGNOSIS — Z8673 Personal history of transient ischemic attack (TIA), and cerebral infarction without residual deficits: Secondary | ICD-10-CM | POA: Diagnosis not present

## 2023-06-12 DIAGNOSIS — Z7982 Long term (current) use of aspirin: Secondary | ICD-10-CM | POA: Diagnosis not present

## 2023-06-12 DIAGNOSIS — E785 Hyperlipidemia, unspecified: Secondary | ICD-10-CM | POA: Diagnosis not present

## 2023-06-12 DIAGNOSIS — Z809 Family history of malignant neoplasm, unspecified: Secondary | ICD-10-CM | POA: Diagnosis not present

## 2023-06-12 DIAGNOSIS — I10 Essential (primary) hypertension: Secondary | ICD-10-CM | POA: Diagnosis not present

## 2023-06-12 DIAGNOSIS — Z7984 Long term (current) use of oral hypoglycemic drugs: Secondary | ICD-10-CM | POA: Diagnosis not present

## 2023-06-12 DIAGNOSIS — Z833 Family history of diabetes mellitus: Secondary | ICD-10-CM | POA: Diagnosis not present

## 2023-06-12 DIAGNOSIS — E119 Type 2 diabetes mellitus without complications: Secondary | ICD-10-CM | POA: Diagnosis not present

## 2023-06-12 DIAGNOSIS — Z5982 Transportation insecurity: Secondary | ICD-10-CM | POA: Diagnosis not present

## 2023-06-12 DIAGNOSIS — Z8249 Family history of ischemic heart disease and other diseases of the circulatory system: Secondary | ICD-10-CM | POA: Diagnosis not present

## 2023-06-21 ENCOUNTER — Other Ambulatory Visit: Payer: Self-pay

## 2023-06-24 ENCOUNTER — Other Ambulatory Visit: Payer: Self-pay

## 2023-07-25 ENCOUNTER — Other Ambulatory Visit: Payer: Self-pay

## 2023-08-01 ENCOUNTER — Other Ambulatory Visit: Payer: Self-pay

## 2023-08-29 ENCOUNTER — Telehealth: Payer: Self-pay

## 2023-08-29 NOTE — Telephone Encounter (Signed)
Pt was called to be scheduled

## 2023-08-30 ENCOUNTER — Other Ambulatory Visit: Payer: Self-pay

## 2023-08-30 ENCOUNTER — Encounter: Payer: Self-pay | Admitting: Pharmacist

## 2023-09-02 ENCOUNTER — Other Ambulatory Visit: Payer: Self-pay

## 2023-09-25 ENCOUNTER — Other Ambulatory Visit: Payer: Self-pay | Admitting: Nurse Practitioner

## 2023-09-25 ENCOUNTER — Ambulatory Visit (INDEPENDENT_AMBULATORY_CARE_PROVIDER_SITE_OTHER): Payer: 59 | Admitting: Nurse Practitioner

## 2023-09-25 ENCOUNTER — Encounter: Payer: Self-pay | Admitting: Nurse Practitioner

## 2023-09-25 ENCOUNTER — Other Ambulatory Visit: Payer: Self-pay

## 2023-09-25 ENCOUNTER — Ambulatory Visit: Payer: 59 | Attending: Nurse Practitioner

## 2023-09-25 VITALS — BP 123/81 | HR 80 | Temp 97.4°F | Wt 205.0 lb

## 2023-09-25 DIAGNOSIS — E1165 Type 2 diabetes mellitus with hyperglycemia: Secondary | ICD-10-CM

## 2023-09-25 DIAGNOSIS — I10 Essential (primary) hypertension: Secondary | ICD-10-CM

## 2023-09-25 DIAGNOSIS — E785 Hyperlipidemia, unspecified: Secondary | ICD-10-CM | POA: Insufficient documentation

## 2023-09-25 DIAGNOSIS — R002 Palpitations: Secondary | ICD-10-CM

## 2023-09-25 DIAGNOSIS — R079 Chest pain, unspecified: Secondary | ICD-10-CM

## 2023-09-25 DIAGNOSIS — M940 Chondrocostal junction syndrome [Tietze]: Secondary | ICD-10-CM

## 2023-09-25 DIAGNOSIS — G8929 Other chronic pain: Secondary | ICD-10-CM

## 2023-09-25 DIAGNOSIS — M25561 Pain in right knee: Secondary | ICD-10-CM | POA: Diagnosis not present

## 2023-09-25 LAB — POCT GLYCOSYLATED HEMOGLOBIN (HGB A1C): Hemoglobin A1C: 9 % — AB (ref 4.0–5.6)

## 2023-09-25 MED ORDER — CYCLOBENZAPRINE HCL 5 MG PO TABS
5.0000 mg | ORAL_TABLET | Freq: Three times a day (TID) | ORAL | 0 refills | Status: DC | PRN
  Filled 2023-09-25: qty 30, 10d supply, fill #0

## 2023-09-25 MED ORDER — GLIPIZIDE 5 MG PO TABS
5.0000 mg | ORAL_TABLET | Freq: Two times a day (BID) | ORAL | 1 refills | Status: DC
  Filled 2023-09-25: qty 60, 30d supply, fill #0

## 2023-09-25 MED ORDER — KETOROLAC TROMETHAMINE 60 MG/2ML IM SOLN
60.0000 mg | Freq: Once | INTRAMUSCULAR | Status: AC
  Administered 2023-09-25: 60 mg via INTRAMUSCULAR

## 2023-09-25 MED ORDER — METFORMIN HCL 1000 MG PO TABS
1000.0000 mg | ORAL_TABLET | Freq: Two times a day (BID) | ORAL | 3 refills | Status: DC
Start: 2023-09-25 — End: 2023-09-25
  Filled 2023-09-25: qty 60, 30d supply, fill #0

## 2023-09-25 MED ORDER — ATORVASTATIN CALCIUM 20 MG PO TABS
20.0000 mg | ORAL_TABLET | Freq: Every day | ORAL | 1 refills | Status: DC
  Filled 2023-09-25: qty 30, 30d supply, fill #0

## 2023-09-25 MED ORDER — METFORMIN HCL 1000 MG PO TABS
1000.0000 mg | ORAL_TABLET | Freq: Two times a day (BID) | ORAL | 1 refills | Status: DC
  Filled 2023-09-25: qty 60, 30d supply, fill #0
  Filled 2023-11-17 – 2023-11-28 (×2): qty 60, 30d supply, fill #1

## 2023-09-25 MED ORDER — IBUPROFEN 600 MG PO TABS
600.0000 mg | ORAL_TABLET | Freq: Three times a day (TID) | ORAL | 1 refills | Status: DC | PRN
  Filled 2023-09-25: qty 30, 10d supply, fill #0

## 2023-09-25 NOTE — Assessment & Plan Note (Signed)
Toradol 60 mg injection given in the office today Ibuprofen 600 mg 3 times daily as needed refilled, encouraged to take medication with food Start Flexeril 5 mg 3 times daily as needed

## 2023-09-25 NOTE — Progress Notes (Signed)
 Established Patient Office Visit  Subjective:  Patient ID: Susan Mathis, female    DOB: 1970-11-19  Age: 53 y.o. MRN: [id number]  CC:  Chief Complaint  Patient presents with   Knee Pain    RIght   Chest Injury    Muscle pain old injury    HPI Susan Mathis is a 53 y.o. female  has a past medical history of Diabetes mellitus without complication (HCC), Fibroids, Fibroids, Hyperlipidemia, Hypertension, and Migraines.  Patient presents for follow-up for her chronic medical conditions.  Type 2 diabetes.  Currently taking glipizide  5 mg on Sundays, metformin  1000 mg daily.  Glipizide  was supposed to be taking 5 mg twice daily and metformin  1000 mg twice daily.  Endorses that her diet can be better.  She reports polyuria, polydipsia denies hypoglycemia   Chest pain.  Patient complains of chest pain rated 8/10.  She describes this as a sharp constant pain that has been worse in the last couple of months.  States that her chest pain is associated with palpitations, shortness of breath.  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  Takes ibuprofen  as needed that helps a little.  Right knee pain.  Patient complains of chronic right knee pain and stiffness for months.  Pain is currently a 5/10.  Denies redness or swelling   Patient declines flu vaccine.  Encouraged to consider getting flu vaccine, pneumococcal vaccine, shingles vaccine and Tdap vaccine.   Past Medical History:  Diagnosis Date   Diabetes mellitus without complication (HCC)    Fibroids    Fibroids    Hyperlipidemia    Hypertension    Migraines     Past Surgical History:  Procedure Laterality Date   CESAREAN SECTION      Family History  Problem Relation Age of Onset   Hypertension Mother    Hypertension Father     Social History   Socioeconomic History   Marital status: Single    Spouse name: Not on file   Number of children: Not on file   Years of education: Not  on file   Highest education level: Not on file  Occupational History   Not on file  Tobacco Use   Smoking status: Never   Smokeless tobacco: Never  Vaping Use   Vaping status: Never Used  Substance and Sexual Activity   Alcohol use: Not Currently   Drug use: Never   Sexual activity: Not on file  Other Topics Concern   Not on file  Social History Narrative   Not on file   Social Drivers of Health   Financial Resource Strain: Not on file  Food Insecurity: Not on file  Transportation Needs: Not on file  Physical Activity: Not on file  Stress: Not on file  Social Connections: Not on file  Intimate Partner Violence: Not on file    Outpatient Medications Prior to Visit  Medication Sig Dispense Refill   amLODipine  (NORVASC ) 10 MG tablet Take 1 tablet (10 mg total) by mouth once daily. 90 tablet 3   aspirin EC 81 MG tablet Take 81 mg by mouth daily. Swallow whole.     Blood Glucose Monitoring Suppl (TRUE METRIX METER) w/Device KIT 1 kit by Does not apply route in the morning, at noon, in the evening, and at bedtime. 1 kit 0   Ferrous Fumarate  (HEMOCYTE - 106 MG FE) 324 (106 Fe) MG TABS tablet Take 1 tablet (  106 mg of iron total) by mouth daily. 90 tablet 0   fluticasone  (FLONASE ) 50 MCG/ACT nasal spray Place 2 sprays into both nostrils daily. 16 g 6   glucose blood (TRUE METRIX BLOOD GLUCOSE TEST) test strip Use as instructed 100 each 12   hydrochlorothiazide  (HYDRODIURIL ) 25 MG tablet Take 1 tablet (25 mg total) by mouth once daily. 90 tablet 3   losartan  (COZAAR ) 25 MG tablet Take 1 tablet (25 mg total) by mouth daily. 90 tablet 0   Multiple Vitamin (MULTIVITAMIN) capsule Take 1 capsule by mouth daily.     TRUEplus Lancets 28G MISC use as directed in the morning, at noon, in the evening, and at bedtime. 210 each 11   atorvastatin  (LIPITOR) 20 MG tablet Take 1 tablet (20 mg total) by mouth daily. 90 tablet 1   glipiZIDE  (GLUCOTROL ) 5 MG tablet Take 1 tablet (5 mg total) by mouth 2  (two) times daily before a meal. 60 tablet 3   ibuprofen  (ADVIL ) 600 MG tablet Take 1 tablet (600 mg total) by mouth 2 (two) times daily as needed. 30 tablet 0   metFORMIN  (GLUCOPHAGE ) 1000 MG tablet Take 1 tablet (1,000 mg total) by mouth 2 (two) times daily with a meal. 180 tablet 3   Aspirin-Acetaminophen-Caffeine (GOODY HEADACHE PO) Take by mouth. (Patient not taking: Reported on 09/25/2023)     ondansetron  (ZOFRAN ) 4 MG tablet Take 1 tablet (4 mg total) by mouth every 8 (eight) hours as needed for nausea or vomiting. (Patient not taking: Reported on 09/25/2023) 20 tablet 0   No facility-administered medications prior to visit.    No Known Allergies  ROS Review of Systems  Constitutional:  Negative for appetite change, chills, fatigue and fever.  HENT:  Negative for congestion, postnasal drip, rhinorrhea and sneezing.   Respiratory:  Positive for shortness of breath. Negative for cough and wheezing.   Cardiovascular:  Positive for palpitations. Negative for chest pain and leg swelling.  Gastrointestinal:  Negative for abdominal pain, constipation, nausea and vomiting.  Endocrine: Positive for polydipsia and polyuria.  Genitourinary:  Negative for difficulty urinating, dysuria, flank pain and frequency.  Musculoskeletal:  Positive for arthralgias. Negative for back pain, joint swelling and myalgias.  Skin:  Negative for color change, pallor, rash and wound.  Neurological:  Negative for dizziness, facial asymmetry, weakness, numbness and headaches.  Psychiatric/Behavioral:  Negative for behavioral problems, confusion, self-injury and suicidal ideas.       Objective:    Physical Exam Vitals and nursing note reviewed.  Constitutional:      General: She is not in acute distress.    Appearance: Normal appearance. She is obese. She is not ill-appearing, toxic-appearing or diaphoretic.  HENT:     Mouth/Throat:     Mouth: Mucous membranes are moist.     Pharynx: Oropharynx is clear. No  oropharyngeal exudate or posterior oropharyngeal erythema.  Eyes:     General: No scleral icterus.       Right eye: No discharge.        Left eye: No discharge.     Extraocular Movements: Extraocular movements intact.     Conjunctiva/sclera: Conjunctivae normal.  Cardiovascular:     Rate and Rhythm: Normal rate and regular rhythm.     Pulses: Normal pulses.     Heart sounds: Normal heart sounds. No murmur heard.    No friction rub. No gallop.  Pulmonary:     Effort: Pulmonary effort is normal. No respiratory distress.  Breath sounds: Normal breath sounds. No stridor. No wheezing, rhonchi or rales.  Chest:     Chest wall: No tenderness.  Abdominal:     General: There is no distension.     Palpations: Abdomen is soft.     Tenderness: There is no abdominal tenderness. There is no right CVA tenderness, left CVA tenderness or guarding.  Musculoskeletal:        General: Tenderness present. No swelling, deformity or signs of injury.     Right lower leg: No edema.     Left lower leg: No edema.     Comments: Tenderness on palpation of right knee area.  No swelling or redness noted  Tenderness on palpation of left side of the anterior chest wall area.  No redness or swelling noted  Skin:    General: Skin is warm and dry.     Capillary Refill: Capillary refill takes less than 2 seconds.     Coloration: Skin is not jaundiced or pale.     Findings: No bruising, erythema or lesion.  Neurological:     Mental Status: She is alert and oriented to person, place, and time.     Motor: No weakness.     Coordination: Coordination normal.     Gait: Gait normal.  Psychiatric:        Mood and Affect: Mood normal.        Behavior: Behavior normal.        Thought Content: Thought content normal.        Judgment: Judgment normal.     BP 123/81   Pulse 80   Temp (!) 97.4 F (36.3 C)   Wt 205 lb (93 kg)   SpO2 98%   BMI 33.09 kg/m  Wt Readings from Last 3 Encounters:  09/25/23 205 lb (93  kg)  02/22/23 213 lb 6.4 oz (96.8 kg)  08/21/22 217 lb (98.4 kg)    Lab Results  Component Value Date   TSH 1.330 02/22/2023   Lab Results  Component Value Date   WBC 7.4 03/24/2020   HGB 13.8 03/24/2020   HCT 42.8 03/24/2020   MCV 84 03/24/2020   PLT 286 03/24/2020   Lab Results  Component Value Date   NA 137 02/22/2023   K 4.2 02/22/2023   CO2 20 02/22/2023   GLUCOSE 211 (H) 02/22/2023   BUN 10 02/22/2023   CREATININE 0.53 (L) 02/22/2023   BILITOT 0.3 02/22/2023   ALKPHOS 67 02/22/2023   AST 13 02/22/2023   ALT 16 02/22/2023   PROT 7.1 02/22/2023   ALBUMIN 4.1 02/22/2023   CALCIUM  9.1 02/22/2023   ANIONGAP 12 03/12/2020   EGFR 111 02/22/2023   Lab Results  Component Value Date   CHOL 151 02/22/2023   Lab Results  Component Value Date   HDL 45 02/22/2023   Lab Results  Component Value Date   LDLCALC 92 02/22/2023   Lab Results  Component Value Date   TRIG 69 02/22/2023   Lab Results  Component Value Date   CHOLHDL 3.4 02/22/2023   Lab Results  Component Value Date   HGBA1C 9.3 (A) 02/22/2023      Assessment & Plan:   Problem List Items Addressed This Visit       Cardiovascular and Mediastinum   Essential hypertension - Primary (Chronic)   Controlled on amlodipine  10 mg daily, losartan  25 mg daily, hydrochlorothiazide  25 mg daily Continue current medications CMP today DASH diet and commitment to daily physical activity  for a minimum of 30 minutes discussed and encouraged, as a part of hypertension management. The importance of attaining a healthy weight is also discussed.     09/25/2023   10:10 AM 09/25/2023    9:45 AM 02/22/2023   12:00 PM 02/22/2023   11:31 AM 02/22/2023   11:26 AM 08/21/2022   10:43 AM 08/21/2022   10:23 AM  BP/Weight  Systolic BP 123 138 130 140 140 134 130  Diastolic BP 81 96 89 94 95 87 84  Wt. (Lbs)  205   213.4    BMI  33.09 kg/m2   34.44 kg/m2             Relevant Medications   atorvastatin  (LIPITOR) 20 MG  tablet   Other Relevant Orders   CMP14+EGFR   CBC     Endocrine   Uncontrolled type 2 diabetes mellitus with hyperglycemia (HCC)    Lab Results  Component Value Date   HGBA1C 9.3 (A) 02/22/2023  Chronic medical condition currently uncontrolled A1c is 9.0 today Patient encouraged to take glipizide  5 mg twice daily and metformin  1000 mg twice daily as ordered Patient counseled on low-carb diet, resources on diabetic education nutrition provided Encouraged to engage in regular moderate exercises at least 150 minutes weekly as tolerated Patient referred for diabetic eye exam, diabetic foot exam completed Patient referred to the clinical pharmacist for assistance with management of type 2 diabetes CBG goals to discussed Follow-up in 3 months      Relevant Medications   glipiZIDE  (GLUCOTROL ) 5 MG tablet   metFORMIN  (GLUCOPHAGE ) 1000 MG tablet   atorvastatin  (LIPITOR) 20 MG tablet   Other Relevant Orders   POCT glycosylated hemoglobin (Hb A1C)   Microalbumin/Creatinine Ratio, Urine   Lipid panel   AMB Referral [facility] Care Management   Ambulatory referral to Ophthalmology     Musculoskeletal and Integument   Costochondritis   Toradol  60 mg injection given in the office today Ibuprofen  600 mg 3 times daily as needed refilled, encouraged to take medication with food Start Flexeril  5 mg 3 times daily as needed      Relevant Medications   ibuprofen  (ADVIL ) 600 MG tablet   cyclobenzaprine  (FLEXERIL ) 5 MG tablet   Other Relevant Orders   EKG 12-Lead     Other   Chronic pain of right knee   Toradol  60 mg injection given in the office today Ibuprofen  600 mg 3 times daily as needed refilled, encouraged to take medication with food Start Flexeril  5 mg 3 times daily as need Right knee x-ray ordered      Relevant Medications   ibuprofen  (ADVIL ) 600 MG tablet   Other Relevant Orders   DG Knee Complete 4 Views Right   Chest pain   EKG done in the office today shows normal sinus  rhythm rate of 89 beats per minutes, possible left atrial enlargement nonspecific T wave abnormality. Her chest pain is most likely musculoskeletal in nature Patient was seen by cardiology in 2023.She was treated for costochondritis at that time with plans to do further cardiac workup but the patient did not follow-up with them. Patient encouraged to call cardiology office and schedule an appointment for follow-up      Relevant Orders   EKG 12-Lead   Ambulatory referral to Cardiology   Hyperlipidemia LDL goal <70   Lab Results  Component Value Date   CHOL 151 02/22/2023   HDL 45 02/22/2023   LDLCALC 92 02/22/2023   TRIG 69 02/22/2023  CHOLHDL 3.4 02/22/2023  Currently on atorvastatin  20 mg daily LDL goal is less than 70 Checking lipid panel Avoid fatty fried foods      Relevant Medications   atorvastatin  (LIPITOR) 20 MG tablet    Meds ordered this encounter  Medications   DISCONTD: metFORMIN  (GLUCOPHAGE ) 1000 MG tablet    Sig: Take 1 tablet (1,000 mg total) by mouth 2 (two) times daily with a meal.    Dispense:  180 tablet    Refill:  3   glipiZIDE  (GLUCOTROL ) 5 MG tablet    Sig: Take 1 tablet (5 mg total) by mouth 2 (two) times daily before a meal.    Dispense:  180 tablet    Refill:  1   metFORMIN  (GLUCOPHAGE ) 1000 MG tablet    Sig: Take 1 tablet (1,000 mg total) by mouth 2 (two) times daily with a meal.    Dispense:  180 tablet    Refill:  1   ibuprofen  (ADVIL ) 600 MG tablet    Sig: Take 1 tablet (600 mg total) by mouth every 8 (eight) hours as needed.    Dispense:  30 tablet    Refill:  1   atorvastatin  (LIPITOR) 20 MG tablet    Sig: Take 1 tablet (20 mg total) by mouth daily.    Dispense:  90 tablet    Refill:  1   cyclobenzaprine  (FLEXERIL ) 5 MG tablet    Sig: Take 1 tablet (5 mg total) by mouth 3 (three) times daily as needed.    Dispense:  30 tablet    Refill:  0    Follow-up: No follow-ups on file.    Loreal Schuessler R Sang Blount, FNP

## 2023-09-25 NOTE — Assessment & Plan Note (Signed)
  Lab Results  Component Value Date   HGBA1C 9.3 (A) 02/22/2023  Chronic medical condition currently uncontrolled A1c is 9.0 today Patient encouraged to take glipizide  5 mg twice daily and metformin  1000 mg twice daily as ordered Patient counseled on low-carb diet, resources on diabetic education nutrition provided Encouraged to engage in regular moderate exercises at least 150 minutes weekly as tolerated Patient referred for diabetic eye exam, diabetic foot exam completed Patient referred to the clinical pharmacist for assistance with management of type 2 diabetes CBG goals to discussed Follow-up in 3 months

## 2023-09-25 NOTE — Assessment & Plan Note (Signed)
 Controlled on amlodipine  10 mg daily, losartan  25 mg daily, hydrochlorothiazide  25 mg daily Continue current medications CMP today DASH diet and commitment to daily physical activity for a minimum of 30 minutes discussed and encouraged, as a part of hypertension management. The importance of attaining a healthy weight is also discussed.     09/25/2023   10:10 AM 09/25/2023    9:45 AM 02/22/2023   12:00 PM 02/22/2023   11:31 AM 02/22/2023   11:26 AM 08/21/2022   10:43 AM 08/21/2022   10:23 AM  BP/Weight  Systolic BP 123 138 130 140 140 134 130  Diastolic BP 81 96 89 94 95 87 84  Wt. (Lbs)  205   213.4    BMI  33.09 kg/m2   34.44 kg/m2

## 2023-09-25 NOTE — Progress Notes (Unsigned)
 Enrolled for [employer] to mail a ZIO XT long term holter monitor to the patients address on file.   EP to read

## 2023-09-25 NOTE — Assessment & Plan Note (Signed)
 Toradol  60 mg injection given in the office today Ibuprofen  600 mg 3 times daily as needed refilled, encouraged to take medication with food Start Flexeril  5 mg 3 times daily as need Right knee x-ray ordered

## 2023-09-25 NOTE — Assessment & Plan Note (Addendum)
 EKG done in the office today shows normal sinus rhythm rate of 89 beats per minutes, possible left atrial enlargement nonspecific T wave abnormality.  Previous EKG reviewed. Her chest pain is most likely musculoskeletal in nature Patient was seen by cardiology in 2023.She was treated for costochondritis at that time with plans to do further cardiac workup but the patient did not follow-up with them. Patient encouraged to call cardiology office and schedule an appointment for follow-up

## 2023-09-25 NOTE — Assessment & Plan Note (Signed)
 Lab Results  Component Value Date   CHOL 151 02/22/2023   HDL 45 02/22/2023   LDLCALC 92 02/22/2023   TRIG 69 02/22/2023   CHOLHDL 3.4 02/22/2023  Currently on atorvastatin  20 mg daily LDL goal is less than 70 Checking lipid panel Avoid fatty fried foods

## 2023-09-25 NOTE — Patient Instructions (Addendum)
 [facility] [phone]   Goal for fasting blood sugar ranges from 80 to 120 and 2 hours after any meal or at bedtime should be between 130 to 170.   1. Uncontrolled type 2 diabetes mellitus with hyperglycemia (HCC)  - POCT glycosylated hemoglobin (Hb A1C) - Microalbumin/Creatinine Ratio, Urine - Lipid panel - glipiZIDE  (GLUCOTROL ) 5 MG tablet; Take 1 tablet (5 mg total) by mouth 2 (two) times daily before a meal.  Dispense: 180 tablet; Refill: 1 - metFORMIN  (GLUCOPHAGE ) 1000 MG tablet; Take 1 tablet (1,000 mg total) by mouth 2 (two) times daily with a meal.  Dispense: 180 tablet; Refill: 1  2. Essential hypertension (Primary)  - CMP14+EGFR - CBC  3. Hyperlipidemia LDL goal <70  - atorvastatin  (LIPITOR) 20 MG tablet; Take 1 tablet (20 mg total) by mouth daily.  Dispense: 90 tablet; Refill: 1   4. Costochondritis  - ibuprofen  (ADVIL ) 600 MG tablet; Take 1 tablet (600 mg total) by mouth every 8 (eight) hours as needed.  Dispense: 30 tablet; Refill: 1 - EKG 12-Lead - cyclobenzaprine  (FLEXERIL ) 5 MG tablet; Take 1 tablet (5 mg total) by mouth 3 (three) times daily as needed.  Dispense: 30 tablet; Refill: 0   5. Chronic pain of right knee  - ibuprofen  (ADVIL ) 600 MG tablet; Take 1 tablet (600 mg total) by mouth every 8 (eight) hours as needed.  Dispense: 30 tablet; Refill: 1 - DG Knee Complete 4 Views Right; Future  6. Chest pain, unspecified type  - EKG 12-Lead - LONG TERM MONITOR (3-14 DAYS); Future - Ambulatory referral to Cardiology       It is important that you exercise regularly at least 30 minutes 5 times a week as tolerated  Think about what you will eat, plan ahead. Choose  clean, green, fresh or frozen over canned, processed or packaged foods which are more sugary, salty and fatty. 70 to 75% of food eaten should be vegetables and fruit. Three meals at set times with snacks allowed between meals, but they must be fruit or vegetables. Aim to eat  over a 12 hour period , example 7 am to 7 pm, and STOP after  your last meal of the day. Drink water,generally about 64 ounces per day, no other drink is as healthy. Fruit juice is best enjoyed in a healthy way, by EATING the fruit.  Thanks for choosing [facility] we consider it a privelige to serve you.

## 2023-09-26 LAB — CMP14+EGFR
ALT: 15 [IU]/L (ref 0–32)
AST: 14 [IU]/L (ref 0–40)
Albumin: 4.7 g/dL (ref 3.8–4.9)
Alkaline Phosphatase: 73 [IU]/L (ref 44–121)
BUN/Creatinine Ratio: 20 (ref 9–23)
BUN: 11 mg/dL (ref 6–24)
Bilirubin Total: 0.3 mg/dL (ref 0.0–1.2)
CO2: 21 mmol/L (ref 20–29)
Calcium: 9.8 mg/dL (ref 8.7–10.2)
Chloride: 96 mmol/L (ref 96–106)
Creatinine, Ser: 0.54 mg/dL — ABNORMAL LOW (ref 0.57–1.00)
Globulin, Total: 2.9 g/dL (ref 1.5–4.5)
Glucose: 178 mg/dL — ABNORMAL HIGH (ref 70–99)
Potassium: 4.1 mmol/L (ref 3.5–5.2)
Sodium: 136 mmol/L (ref 134–144)
Total Protein: 7.6 g/dL (ref 6.0–8.5)
eGFR: 110 mL/min/{1.73_m2} (ref >59)

## 2023-09-26 LAB — CBC
Hematocrit: 38.7 % (ref 34.0–46.6)
Hemoglobin: 12.5 g/dL (ref 11.1–15.9)
MCH: 25.8 pg — ABNORMAL LOW (ref 26.6–33.0)
MCHC: 32.3 g/dL (ref 31.5–35.7)
MCV: 80 fL (ref 79–97)
Platelets: 316 10*3/uL (ref 150–450)
RBC: 4.85 x10E6/uL (ref 3.77–5.28)
RDW: 13.7 % (ref 11.7–15.4)
WBC: 5.3 10*3/uL (ref 3.4–10.8)

## 2023-09-26 LAB — LIPID PANEL
Chol/HDL Ratio: 4.1 {ratio} (ref 0.0–4.4)
Cholesterol, Total: 206 mg/dL — ABNORMAL HIGH (ref 100–199)
HDL: 50 mg/dL (ref >39)
LDL Chol Calc (NIH): 140 mg/dL — ABNORMAL HIGH (ref 0–99)
Triglycerides: 90 mg/dL (ref 0–149)
VLDL Cholesterol Cal: 16 mg/dL (ref 5–40)

## 2023-09-26 LAB — MICROALBUMIN / CREATININE URINE RATIO
Creatinine, Urine: 138.2 mg/dL
Microalb/Creat Ratio: 40 mg/g{creat} — ABNORMAL HIGH (ref 0–29)
Microalbumin, Urine: 55.5 ug/mL

## 2023-09-27 ENCOUNTER — Other Ambulatory Visit: Payer: Self-pay

## 2023-09-27 ENCOUNTER — Other Ambulatory Visit: Payer: Self-pay | Admitting: Nurse Practitioner

## 2023-09-27 DIAGNOSIS — E785 Hyperlipidemia, unspecified: Secondary | ICD-10-CM

## 2023-09-27 MED ORDER — ATORVASTATIN CALCIUM 40 MG PO TABS
40.0000 mg | ORAL_TABLET | Freq: Every day | ORAL | 1 refills | Status: DC
  Filled 2023-09-27: qty 30, 30d supply, fill #0
  Filled 2023-11-17 – 2023-11-28 (×2): qty 30, 30d supply, fill #1

## 2023-10-01 ENCOUNTER — Other Ambulatory Visit: Payer: Self-pay

## 2023-10-03 ENCOUNTER — Telehealth: Payer: Self-pay

## 2023-10-03 DIAGNOSIS — Z9189 Other specified personal risk factors, not elsewhere classified: Secondary | ICD-10-CM

## 2023-10-03 NOTE — Progress Notes (Signed)
   10/03/2023  Patient ID: Susan Mathis, female   DOB: 08-05-1971, 53 y.o.   MRN: [id number]  Attempted to contact patient for medication management/review. Left HIPAA compliant message for patient to return my call at their convenience.   First attempt for patient outreach. Will follow up with patient in 3-5 business days.  Thank you for allowing pharmacy to be a part of this patient's care.  Cephus Shelling, PharmD Clinical Pharmacist Cell: [phone]

## 2023-10-09 ENCOUNTER — Telehealth: Payer: Self-pay

## 2023-10-09 DIAGNOSIS — Z79899 Other long term (current) drug therapy: Secondary | ICD-10-CM

## 2023-10-09 NOTE — Progress Notes (Signed)
   10/09/2023  Patient ID: Susan Mathis, female   DOB: 12-07-1970, 53 y.o.   MRN: [id number]  Attempted to contact patient for medication management/review. Left HIPAA compliant message for patient to return my call at their convenience.   Second attempt for patient outreach. Will follow up with patient in 7-10 business days.  Thank you for allowing pharmacy to be a part of this patient's care.  Cephus Shelling, PharmD Clinical Pharmacist Cell: [phone]

## 2023-10-13 ENCOUNTER — Other Ambulatory Visit: Payer: Self-pay | Admitting: Nurse Practitioner

## 2023-10-13 DIAGNOSIS — I10 Essential (primary) hypertension: Secondary | ICD-10-CM

## 2023-10-13 MED ORDER — LOSARTAN POTASSIUM 25 MG PO TABS
25.0000 mg | ORAL_TABLET | Freq: Every day | ORAL | 0 refills | Status: DC
  Filled 2023-10-13 – 2023-10-25 (×2): qty 30, 30d supply, fill #0
  Filled 2023-11-17 – 2023-11-28 (×2): qty 30, 30d supply, fill #1

## 2023-10-14 ENCOUNTER — Other Ambulatory Visit: Payer: Self-pay

## 2023-10-20 IMAGING — DX DG CHEST 2V
2 series · 2 of 2 positions shown · non-contrast
Comparison: None.

CLINICAL DATA: Chest pain

EXAM:
CHEST - 2 VIEW

[chest pa]
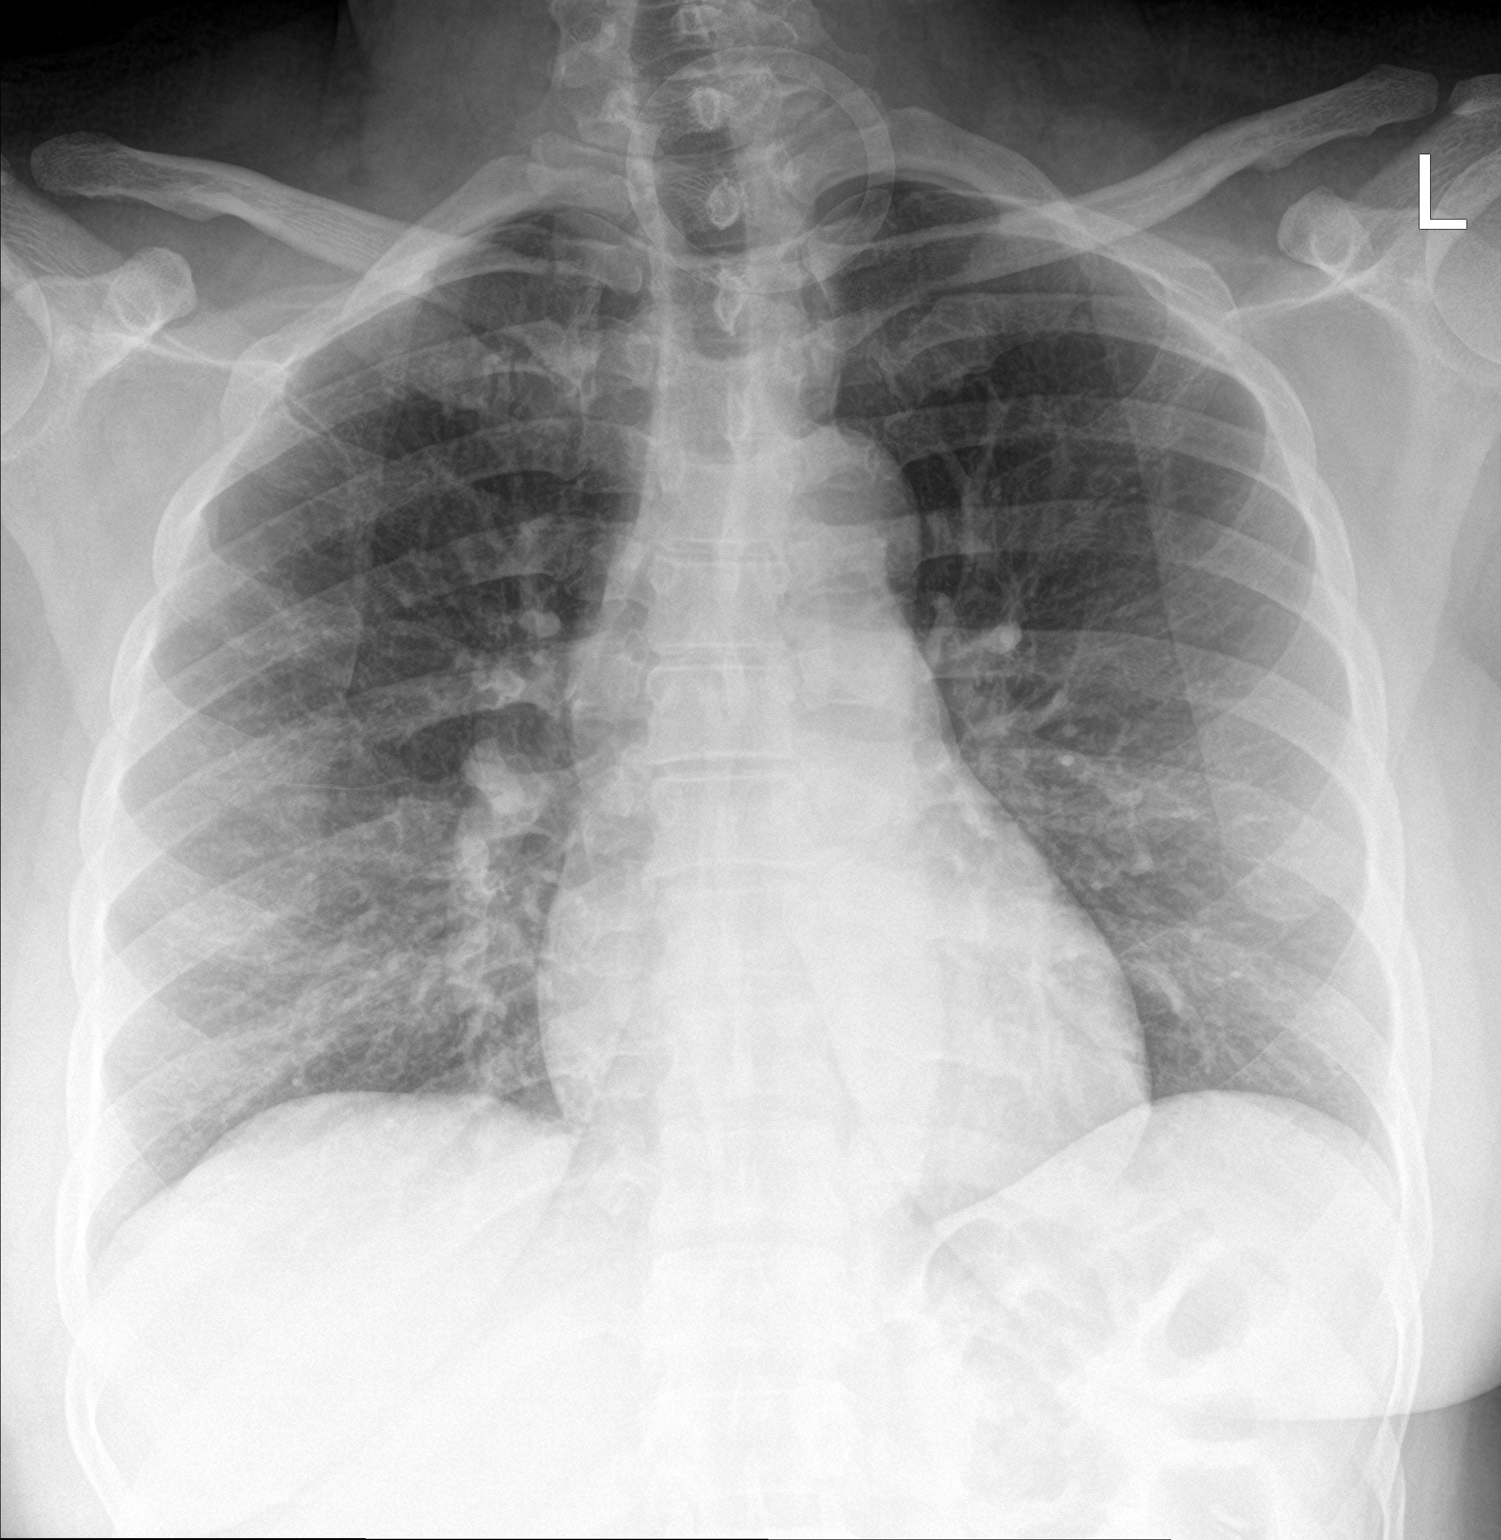

[chest lat]
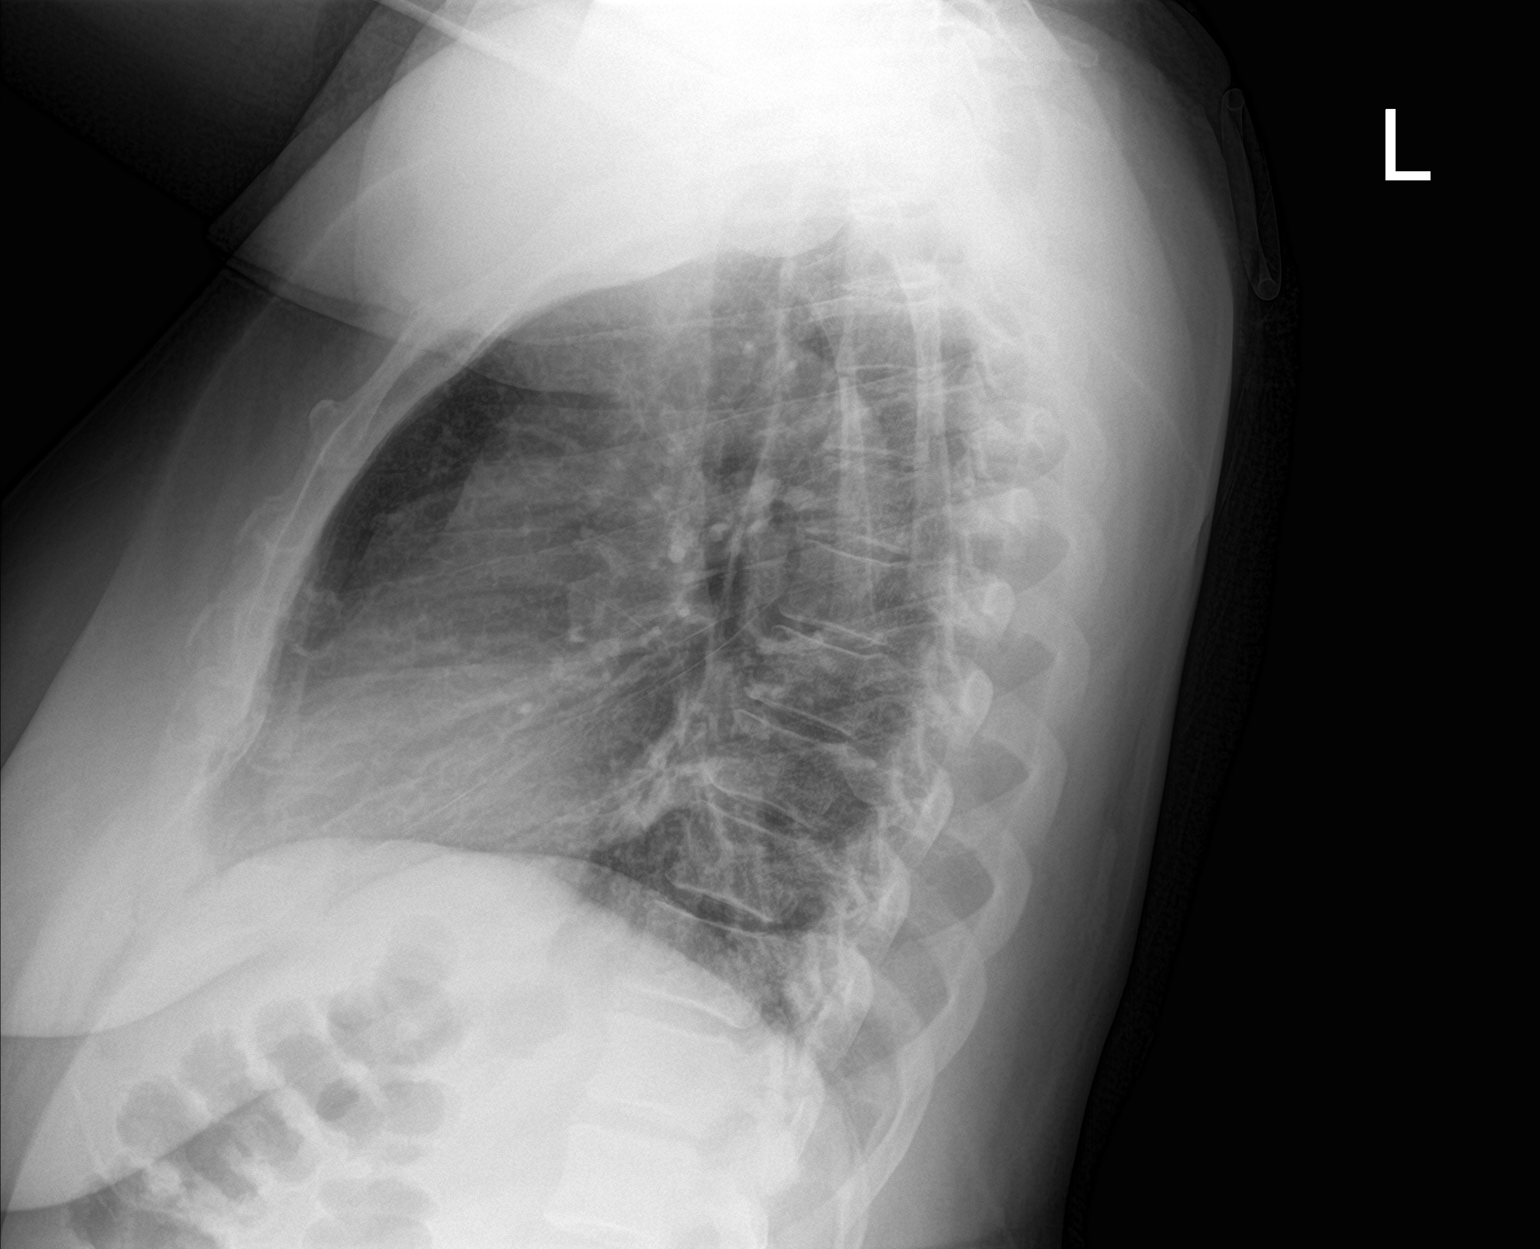

[2 of 2 positions shown; findings below may reference images not displayed]

FINDINGS: The heart size and mediastinal contours are within normal limits.
Both lungs are clear. The visualized skeletal structures are
unremarkable.
IMPRESSION: No acute abnormality of the lungs.

## 2023-10-23 ENCOUNTER — Telehealth: Payer: Self-pay

## 2023-10-23 DIAGNOSIS — Z9189 Other specified personal risk factors, not elsewhere classified: Secondary | ICD-10-CM

## 2023-10-23 NOTE — Progress Notes (Signed)
   10/23/2023  Patient ID: Susan Mathis, female   DOB: 13-Jun-1971, 53 y.o.   MRN: [id number]  Contacted patient regarding referral for diabetes and medication management from Donell Beers, FNP .   Appointment scheduled  10/24/2023 @ 11:30 AM   Cephus Shelling, PharmD Clinical Pharmacist Cell: [phone]

## 2023-10-24 ENCOUNTER — Other Ambulatory Visit: Payer: Self-pay

## 2023-10-24 ENCOUNTER — Telehealth: Payer: Self-pay

## 2023-10-24 DIAGNOSIS — Z9189 Other specified personal risk factors, not elsewhere classified: Secondary | ICD-10-CM

## 2023-10-24 NOTE — Progress Notes (Signed)
   10/24/2023  Patient ID: Susan Mathis, female   DOB: 1971/03/18, 52 y.o.   MRN: [id number]  Attempted to contact patient for medication management/review. Left HIPAA compliant message for patient to return my call at their convenience.   Third attempt for patient outreach. Will close referral as patient was difficult to engage, but happy to help with any future patient care needs.  Thank you for allowing pharmacy to be a part of this patient's care.  Cephus Shelling, PharmD Clinical Pharmacist Cell: [phone]

## 2023-10-24 NOTE — Telephone Encounter (Signed)
 Patient called returning pharmacist call about her medication. Please f/u with patient

## 2023-10-25 ENCOUNTER — Other Ambulatory Visit: Payer: Self-pay

## 2023-10-28 ENCOUNTER — Telehealth: Payer: Self-pay

## 2023-10-28 DIAGNOSIS — Z9189 Other specified personal risk factors, not elsewhere classified: Secondary | ICD-10-CM

## 2023-10-28 NOTE — Progress Notes (Signed)
   10/28/2023  Patient ID: Susan Mathis, female   DOB: [birth year], 53 y.o.   MRN: [id number]  Attempted to contact patient for medication management/review. Left HIPAA compliant message for patient to return my call at their convenience.   Fourth attempt for patient outreach - returned patient's call. Will close referral as patient was difficult to engage, but happy to help with any future patient care needs.  Thank you for allowing pharmacy to be a part of this patient's care.  Cephus Shelling, PharmD Clinical Pharmacist Cell: [phone]

## 2023-11-18 ENCOUNTER — Encounter (HOSPITAL_BASED_OUTPATIENT_CLINIC_OR_DEPARTMENT_OTHER): Payer: Self-pay

## 2023-11-18 ENCOUNTER — Other Ambulatory Visit: Payer: Self-pay

## 2023-11-20 ENCOUNTER — Ambulatory Visit: Payer: 59 | Admitting: Cardiology

## 2023-11-20 ENCOUNTER — Ambulatory Visit: Payer: 59 | Attending: Cardiology | Admitting: Cardiology

## 2023-11-20 NOTE — Progress Notes (Deleted)
  Cardiology Office Note:  .   Date:  11/20/2023  ID:  Lona Six, DOB 1971-06-10, MRN [id number] PCP: Donell Beers, FNP  [facility] Providers Cardiologist:  Bryan Lemma, MD { Click to update primary MD,subspecialty MD or APP then REFRESH:1}    No chief complaint on file.   Patient Profile: .     Susan Mathis is a somewhat nervous and anxious 53 y.o. female with a PMH notable for HTN and DM-2 who presents here for 2-year follow-up.  At the request of Donell Beers, FNP.    Anayiah Demauro was last seen on 10/20/2021 for evaluation of left-sided chest pain.  She has significant left-sided chest soreness improved with ibuprofen.  She is very fatigued and exhausted her back hurts more when she exerts yourself than anything else.  Notice of palpitations that are rare and occasional leg swelling in joints.  Subjective  Discussed the use of AI scribe software for clinical note transcription with the patient, who gave verbal consent to proceed.  History of Present Illness     Cardiovascular ROS: {roscv:310661}  ROS:  Review of Systems - {ros master:310782}    Objective    Studies Reviewed: Marland Kitchen        ECHO: *** CATH: *** Zio patch MONITOR: *** CT: ***  Risk Assessment/Calculations:   {Does this patient have ATRIAL FIBRILLATION?[phone]} No BP recorded.  {Refresh Note OR Click here to enter BP  :1}***         Physical Exam:   VS:  There were no vitals taken for this visit.   Wt Readings from Last 3 Encounters:  09/25/23 205 lb (93 kg)  02/22/23 213 lb 6.4 oz (96.8 kg)  08/21/22 217 lb (98.4 kg)    GEN: Well nourished, well developed in no acute distress; *** NECK: No JVD; No carotid bruits CARDIAC: Normal S1, S2; RRR, no murmurs, rubs, gallops RESPIRATORY:  Clear to auscultation without rales, wheezing or rhonchi ; nonlabored, good air movement. ABDOMEN: Soft, non-tender, non-distended EXTREMITIES:  No edema; No deformity       ASSESSMENT AND PLAN: .    Problem List Items Addressed This Visit   None   Assessment and Plan Assessment & Plan        {Are you ordering a CV Procedure (e.g. stress test, cath, DCCV, TEE, etc)?   Press F2        :[id number]}   Follow-Up: No follow-ups on file.  Total time spent: *** min spent with patient + *** min spent charting = *** min    Signed, Marykay Lex, MD, MS Bryan Lemma, M.D., M.S. Interventional Cardiologist  [facility]  Pager # [phone] Phone # [phone] [address] [place], [place] [address]

## 2023-11-21 ENCOUNTER — Encounter: Payer: Self-pay | Admitting: Cardiology

## 2023-11-27 ENCOUNTER — Other Ambulatory Visit: Payer: Self-pay

## 2023-11-28 ENCOUNTER — Other Ambulatory Visit: Payer: Self-pay

## 2023-12-03 ENCOUNTER — Encounter: Payer: Self-pay | Admitting: Nurse Practitioner

## 2023-12-04 ENCOUNTER — Other Ambulatory Visit: Payer: Self-pay

## 2023-12-14 ENCOUNTER — Ambulatory Visit (HOSPITAL_COMMUNITY)
Admission: EM | Admit: 2023-12-14 | Discharge: 2023-12-14 | Disposition: A | Discharge status: Home / Self Care | Attending: Emergency Medicine | Admitting: Emergency Medicine

## 2023-12-14 ENCOUNTER — Encounter (HOSPITAL_COMMUNITY): Payer: Self-pay

## 2023-12-14 DIAGNOSIS — M5441 Lumbago with sciatica, right side: Secondary | ICD-10-CM | POA: Diagnosis not present

## 2023-12-14 MED ORDER — DEXAMETHASONE SODIUM PHOSPHATE 10 MG/ML IJ SOLN
INTRAMUSCULAR | Status: AC
  Filled 2023-12-14: qty 1

## 2023-12-14 MED ORDER — BACLOFEN 10 MG PO TABS: 10.0000 mg | ORAL_TABLET | Freq: Three times a day (TID) | ORAL | 0 refills | Status: AC

## 2023-12-14 MED ORDER — DEXAMETHASONE SODIUM PHOSPHATE 10 MG/ML IJ SOLN
10.0000 mg | Freq: Once | INTRAMUSCULAR | Status: AC
  Administered 2023-12-14: 10 mg via INTRAMUSCULAR
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
## 2023-12-14 NOTE — Discharge Instructions (Addendum)
 You are given an injection of steroids today to help with inflammation causing your pain.   Take baclofen 3 times daily for muscle pain and spasms.  This can make you drowsy so do not drive, work, or drink alcohol while taking this. Otherwise she can take 650 mg of Tylenol every 4-6 hours as needed for pain. Alternate between heat and ice as needed for pain, and do some gentle stretching. Follow-up with [facility] if your pain persist. Follow-up with your primary care doctor or return here as needed.

## 2023-12-23 ENCOUNTER — Encounter: Payer: Self-pay | Admitting: Nurse Practitioner

## 2023-12-23 ENCOUNTER — Other Ambulatory Visit: Payer: Self-pay

## 2023-12-23 ENCOUNTER — Ambulatory Visit (INDEPENDENT_AMBULATORY_CARE_PROVIDER_SITE_OTHER): Payer: Self-pay | Admitting: Nurse Practitioner

## 2023-12-23 VITALS — BP 128/80 | HR 91 | Temp 97.6°F | Wt 203.0 lb

## 2023-12-23 DIAGNOSIS — E1165 Type 2 diabetes mellitus with hyperglycemia: Secondary | ICD-10-CM | POA: Diagnosis not present

## 2023-12-23 DIAGNOSIS — R079 Chest pain, unspecified: Secondary | ICD-10-CM | POA: Diagnosis not present

## 2023-12-23 DIAGNOSIS — M545 Low back pain, unspecified: Secondary | ICD-10-CM | POA: Diagnosis not present

## 2023-12-23 DIAGNOSIS — I10 Essential (primary) hypertension: Secondary | ICD-10-CM | POA: Diagnosis not present

## 2023-12-23 DIAGNOSIS — E785 Hyperlipidemia, unspecified: Secondary | ICD-10-CM | POA: Diagnosis not present

## 2023-12-23 DIAGNOSIS — M940 Chondrocostal junction syndrome [Tietze]: Secondary | ICD-10-CM

## 2023-12-23 DIAGNOSIS — G8929 Other chronic pain: Secondary | ICD-10-CM

## 2023-12-23 DIAGNOSIS — M25561 Pain in right knee: Secondary | ICD-10-CM

## 2023-12-23 LAB — POCT GLYCOSYLATED HEMOGLOBIN (HGB A1C): Hemoglobin A1C: 11.2 % — AB (ref 4.0–5.6)

## 2023-12-23 MED ORDER — METFORMIN HCL 1000 MG PO TABS
1000.0000 mg | ORAL_TABLET | Freq: Two times a day (BID) | ORAL | 1 refills | Status: AC
  Filled 2023-12-23: qty 60, 30d supply, fill #0
  Filled 2024-02-15: qty 60, 30d supply, fill #1
  Filled 2024-03-20: qty 60, 30d supply, fill #2
  Filled 2024-04-19: qty 60, 30d supply, fill #3
  Filled 2024-06-01: qty 60, 30d supply, fill #0
  Filled 2024-07-06: qty 60, 30d supply, fill #1
  Filled ????-??-??: fill #4

## 2023-12-23 MED ORDER — GLIPIZIDE 5 MG PO TABS
5.0000 mg | ORAL_TABLET | Freq: Two times a day (BID) | ORAL | 1 refills | Status: AC
  Filled 2023-12-23: qty 60, 30d supply, fill #0
  Filled 2024-01-18: qty 60, 30d supply, fill #1
  Filled 2024-03-20: qty 60, 30d supply, fill #2
  Filled 2024-04-19: qty 60, 30d supply, fill #3
  Filled 2024-06-01: qty 60, 30d supply, fill #0
  Filled 2024-07-06: qty 60, 30d supply, fill #1
  Filled ????-??-??: fill #4

## 2023-12-23 MED ORDER — HYDROCHLOROTHIAZIDE 25 MG PO TABS
25.0000 mg | ORAL_TABLET | Freq: Every day | ORAL | 3 refills | Status: AC
  Filled 2023-12-23: qty 30, 30d supply, fill #0
  Filled 2024-01-18: qty 30, 30d supply, fill #1
  Filled 2024-02-15: qty 30, 30d supply, fill #2
  Filled 2024-03-20: qty 30, 30d supply, fill #3
  Filled 2024-04-19: qty 30, 30d supply, fill #4
  Filled 2024-06-01: qty 30, 30d supply, fill #0
  Filled 2024-07-06: qty 30, 30d supply, fill #1
  Filled ????-??-??: fill #5

## 2023-12-23 MED ORDER — IBUPROFEN 600 MG PO TABS
600.0000 mg | ORAL_TABLET | Freq: Three times a day (TID) | ORAL | 1 refills | Status: DC | PRN
  Filled 2023-12-23: qty 30, 10d supply, fill #0
  Filled 2024-03-20: qty 30, 10d supply, fill #1

## 2023-12-23 MED ORDER — LOSARTAN POTASSIUM 25 MG PO TABS
25.0000 mg | ORAL_TABLET | Freq: Every day | ORAL | 1 refills | Status: AC
  Filled 2023-12-23: qty 30, 30d supply, fill #0
  Filled 2024-01-18: qty 30, 30d supply, fill #1
  Filled 2024-03-20: qty 30, 30d supply, fill #2
  Filled 2024-04-19: qty 30, 30d supply, fill #3
  Filled 2024-06-01: qty 30, 30d supply, fill #0
  Filled 2024-07-06: qty 30, 30d supply, fill #1
  Filled ????-??-??: fill #4

## 2023-12-23 MED ORDER — AMLODIPINE BESYLATE 10 MG PO TABS
10.0000 mg | ORAL_TABLET | Freq: Every day | ORAL | 3 refills | Status: AC
  Filled 2023-12-23: qty 30, 30d supply, fill #0
  Filled 2024-01-18: qty 30, 30d supply, fill #1
  Filled 2024-02-15: qty 30, 30d supply, fill #2
  Filled 2024-03-20: qty 30, 30d supply, fill #3
  Filled 2024-04-19: qty 30, 30d supply, fill #4
  Filled 2024-06-01: qty 30, 30d supply, fill #0
  Filled 2024-07-06: qty 30, 30d supply, fill #1
  Filled ????-??-??: fill #5

## 2023-12-23 MED ORDER — KETOROLAC TROMETHAMINE 30 MG/ML IJ SOLN
30.0000 mg | Freq: Once | INTRAMUSCULAR | Status: AC
  Administered 2023-12-23: 30 mg via INTRAMUSCULAR
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
## 2023-12-23 NOTE — Assessment & Plan Note (Addendum)
 Continue amlodipine  10 mg daily, losartan  25 mg daily DASH diet and commitment to daily physical activity for a minimum of 30 minutes discussed and encouraged, as a part of hypertension management.      12/23/2023    9:53 AM 12/14/2023   11:12 AM 09/25/2023   10:10 AM 09/25/2023    9:45 AM 02/22/2023   12:00 PM 02/22/2023   11:31 AM 02/22/2023   11:26 AM  BP/Weight  Systolic BP 128 143 123 138 130 140 140  Diastolic BP 80 92 81 96 89 94 95  Wt. (Lbs) 203   205   213.4  BMI 32.77 kg/m2   33.09 kg/m2   34.44 kg/m2

## 2023-12-23 NOTE — Patient Instructions (Addendum)
     Eye doctor for diabetic eye exam  [address] [place] [address]  P:  [phone] F:  [phone]  Goal for fasting blood sugar ranges from 80 to 120 and 2 hours after any meal or at bedtime should be between 130 to 170.   1. Uncontrolled type 2 diabetes mellitus with hyperglycemia (HCC) (Primary)  - POCT glycosylated hemoglobin (Hb A1C) - glipiZIDE  (GLUCOTROL ) 5 MG tablet; Take 1 tablet (5 mg total) by mouth 2 (two) times daily before a meal.  Dispense: 180 tablet; Refill: 1 - AMB Referral VBCI Care Management - metFORMIN  (GLUCOPHAGE ) 1000 MG tablet; Take 1 tablet (1,000 mg total) by mouth 2 (two) times daily with a meal.  Dispense: 180 tablet; Refill: 1  2. Hyperlipidemia LDL goal <70  - Direct LDL  3. Essential hypertension  - amLODipine  (NORVASC ) 10 MG tablet; Take 1 tablet (10 mg total) by mouth once daily.  Dispense: 90 tablet; Refill: 3 - hydrochlorothiazide  (HYDRODIURIL ) 25 MG tablet; Take 1 tablet (25 mg total) by mouth once daily.  Dispense: 90 tablet; Refill: 3 - losartan  (COZAAR ) 25 MG tablet; Take 1 tablet (25 mg total) by mouth daily.  Dispense: 90 tablet; Refill: 1  4. Chest pain, unspecified type   5. Acute bilateral low back pain without sciatica You were given toradol  30mg  injection today so do not take ibuprofen  today , ok to take tylenol 650mg  every 6 hours as needed, can try taking baclofen  at bedtime as needed for muscle spasm    - ibuprofen  (ADVIL ) 600 MG tablet; Take 1 tablet (600 mg total) by mouth every 8 (eight) hours as needed.  Dispense: 30 tablet; Refill: 1 - Ambulatory referral to Orthopedic Surgery  6. Costochondritis  - ibuprofen  (ADVIL ) 600 MG tablet; Take 1 tablet (600 mg total) by mouth every 8 (eight) hours as needed.  Dispense: 30 tablet; Refill: 1   It is important that you exercise regularly at least 30 minutes 5 times a week as tolerated  Think about what you will eat, plan ahead. Choose " clean, green, fresh or  frozen" over canned, processed or packaged foods which are more sugary, salty and fatty. 70 to 75% of food eaten should be vegetables and fruit. Three meals at set times with snacks allowed between meals, but they must be fruit or vegetables. Aim to eat over a 12 hour period , example 7 am to 7 pm, and STOP after  your last meal of the day. Drink water,generally about 64 ounces per day, no other drink is as healthy. Fruit juice is best enjoyed in a healthy way, by EATING the fruit.  Thanks for choosing [facility] we consider it a privelige to serve you.

## 2023-12-23 NOTE — Assessment & Plan Note (Signed)
 Continue ibuprofen  600 mg 3 times daily as needed, alternate with Tylenol 650 mg every 6 hours as needed

## 2023-12-23 NOTE — Assessment & Plan Note (Signed)
 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 Continue ibuprofen  600 mg 3 times daily as needed, alternate with Tylenol 650 mg every 6 hours as needed

## 2023-12-23 NOTE — Assessment & Plan Note (Addendum)
 Lab Results  Component Value Date   CHOL 206 (H) 09/25/2023   HDL 50 09/25/2023   LDLCALC 140 (H) 09/25/2023   TRIG 90 09/25/2023   CHOLHDL 4.1 09/25/2023  On atorvastatin  40 mg daily Checking lipid panel LDL goal is less than 70

## 2023-12-23 NOTE — Addendum Note (Signed)
 Addended by: Julian Obey on: 12/23/2023 03:16 PM   Modules accepted: Orders

## 2023-12-23 NOTE — Assessment & Plan Note (Signed)
 She was referred to cardiology but she did not follow-up Patient encouraged to consider following up with cardiology

## 2023-12-23 NOTE — Assessment & Plan Note (Addendum)
 Toradol  30 mg injection given take ibuprofen  600 mg 3 times daily as needed, continue tomorrow Alternate ibuprofen  with Tylenol 650 mg every 6 hours as needed Pain take baclofen  10 mg at bedtime as needed due to drowsiness Follow-up with orthopedics if no improvement in back pain

## 2023-12-23 NOTE — Assessment & Plan Note (Signed)
 Lab Results  Component Value Date   HGBA1C 11.2 (A) 12/23/2023  Chronic uncontrolled condition A1c is worse from 9.0 3 months ago Encouraged to take glipizide  5 mg twice daily and metformin  1000 mg twice daily as ordered Patient counseled on low-carb diet, CBG goals discussed She is not interested in taking an injectable medication for diabetes New referral placed to the clinical pharmacist, patient encouraged to follow-up with them Encouraged to get diabetes eye exam done Follow-up in the office in 4 weeks

## 2023-12-24 ENCOUNTER — Other Ambulatory Visit: Payer: Self-pay | Admitting: Nurse Practitioner

## 2023-12-24 ENCOUNTER — Other Ambulatory Visit: Payer: Self-pay

## 2023-12-24 DIAGNOSIS — E785 Hyperlipidemia, unspecified: Secondary | ICD-10-CM

## 2023-12-24 LAB — LIPID PANEL
Chol/HDL Ratio: 3.4 ratio (ref 0.0–4.4)
Cholesterol, Total: 147 mg/dL (ref 100–199)
HDL: 43 mg/dL (ref >39)
LDL Chol Calc (NIH): 87 mg/dL (ref 0–99)
Triglycerides: 90 mg/dL (ref 0–149)
VLDL Cholesterol Cal: 17 mg/dL (ref 5–40)

## 2023-12-24 MED ORDER — ATORVASTATIN CALCIUM 80 MG PO TABS
80.0000 mg | ORAL_TABLET | Freq: Every day | ORAL | 3 refills | Status: AC
  Filled 2023-12-24 – 2024-01-18 (×2): qty 30, 30d supply, fill #0
  Filled 2024-02-15: qty 30, 30d supply, fill #1
  Filled 2024-03-20: qty 30, 30d supply, fill #2
  Filled 2024-04-19: qty 30, 30d supply, fill #3
  Filled 2024-06-01: qty 30, 30d supply, fill #0
  Filled 2024-07-06: qty 30, 30d supply, fill #1
  Filled ????-??-??: fill #4

## 2023-12-27 ENCOUNTER — Other Ambulatory Visit: Payer: Self-pay

## 2024-01-03 ENCOUNTER — Other Ambulatory Visit: Payer: Self-pay

## 2024-01-06 ENCOUNTER — Ambulatory Visit (INDEPENDENT_AMBULATORY_CARE_PROVIDER_SITE_OTHER): Admitting: Physical Medicine and Rehabilitation

## 2024-01-06 ENCOUNTER — Telehealth: Payer: Self-pay | Admitting: *Deleted

## 2024-01-06 ENCOUNTER — Other Ambulatory Visit (INDEPENDENT_AMBULATORY_CARE_PROVIDER_SITE_OTHER): Payer: Self-pay

## 2024-01-06 VITALS — BP 130/82 | HR 88

## 2024-01-06 DIAGNOSIS — M5441 Lumbago with sciatica, right side: Secondary | ICD-10-CM

## 2024-01-06 DIAGNOSIS — M5416 Radiculopathy, lumbar region: Secondary | ICD-10-CM | POA: Diagnosis not present

## 2024-01-06 DIAGNOSIS — M7918 Myalgia, other site: Secondary | ICD-10-CM | POA: Diagnosis not present

## 2024-01-06 DIAGNOSIS — G8929 Other chronic pain: Secondary | ICD-10-CM

## 2024-01-06 NOTE — Progress Notes (Signed)
 Care Guide Pharmacy Note  01/06/2024 Name: Susan Mathis MRN: [id number] DOB: 06-Oct-1970  Referred By: Paseda, Folashade R, FNP Reason for referral: Complex Care Management (Initial outreach to schedule referral with PharmD )   Susan Mathis is a 53 y.o. year old female who is a primary care patient of Paseda, Folashade R, FNP.  Susan Mathis was referred to the pharmacist for assistance related to: DMII  An unsuccessful telephone outreach was attempted today to contact the patient who was referred to the pharmacy team for assistance with medication management. Additional attempts will be made to contact the patient.  Susan Mathis  [facility]  Value-Based Care Institute, [facility] Guide  Direct Dial: [phone]  Fax [phone]
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
## 2024-01-07 ENCOUNTER — Encounter: Payer: Self-pay | Admitting: Physical Medicine and Rehabilitation

## 2024-01-08 ENCOUNTER — Encounter: Payer: Self-pay | Admitting: Physical Medicine and Rehabilitation

## 2024-01-08 ENCOUNTER — Telehealth: Payer: Self-pay | Admitting: Nurse Practitioner

## 2024-01-08 NOTE — Telephone Encounter (Signed)
 Patient was identified as falling into the [place] Measure - Diabetes.   Patient was: Referred to pharmacy for chronic disease management.   Pharmacy is attempting to reach patient

## 2024-01-09 ENCOUNTER — Encounter: Payer: Self-pay | Admitting: Physical Medicine and Rehabilitation

## 2024-01-09 NOTE — Progress Notes (Signed)
 Care Guide Pharmacy Note  01/09/2024 Name: Susan Mathis MRN: [id number] DOB: December 04, 1970  Referred By: Paseda, Folashade R, FNP Reason for referral: Complex Care Management (Initial outreach to schedule referral with PharmD )   Susan Mathis is a 53 y.o. year old female who is a primary care patient of Paseda, Folashade R, FNP.  Susan Mathis was referred to the pharmacist for assistance related to: DMII  A second unsuccessful telephone outreach was attempted today to contact the patient who was referred to the pharmacy team for assistance with medication management. Additional attempts will be made to contact the patient.  Susan Mathis  [facility]  Value-Based Care Institute, [facility] Guide  Direct Dial: [phone]  Fax [phone]

## 2024-01-09 NOTE — Progress Notes (Signed)
 Care Guide Pharmacy Note  01/09/2024 Name: [name] MRN: [id number] DOB: [birth year]  Referred By: Paseda, Folashade R, FNP Reason for referral: Complex Care Management (Initial outreach to schedule referral with PharmD )   Susan Mathis is a 53 y.o. year old female who is a primary care patient of Paseda, Folashade R, FNP.  Salem Stepney was referred to the pharmacist for assistance related to: DMII  Successful contact was made with the patient to discuss pharmacy services including being ready for the pharmacist to call at least 5 minutes before the scheduled appointment time and to have medication bottles and any blood pressure readings ready for review. The patient agreed to meet with the pharmacist via telephone visit on (date/time). 6/9 at 230 PM   Barnie Bora  [facility], [facility] Guide  Direct Dial: [phone]  Fax [phone]

## 2024-01-10 ENCOUNTER — Encounter: Payer: Self-pay | Admitting: Physical Medicine and Rehabilitation

## 2024-01-14 ENCOUNTER — Other Ambulatory Visit

## 2024-01-18 ENCOUNTER — Other Ambulatory Visit: Payer: Self-pay

## 2024-01-18 ENCOUNTER — Other Ambulatory Visit (HOSPITAL_BASED_OUTPATIENT_CLINIC_OR_DEPARTMENT_OTHER): Payer: Self-pay

## 2024-01-20 ENCOUNTER — Ambulatory Visit: Attending: Physical Medicine and Rehabilitation | Admitting: Physical Therapy

## 2024-01-20 ENCOUNTER — Other Ambulatory Visit: Payer: Self-pay

## 2024-01-20 NOTE — Therapy (Deleted)
 OUTPATIENT PHYSICAL THERAPY THORACOLUMBAR EVALUATION   Patient Name: Susan Mathis MRN: [id number] DOB:[birth year], 53 y.o., female Today's Date: 01/20/2024  END OF SESSION:   Past Medical History:  Diagnosis Date   Diabetes mellitus without complication (HCC)    Fibroids    Fibroids    Hyperlipidemia    Hypertension    Migraines    Past Surgical History:  Procedure Laterality Date   CESAREAN SECTION     Patient Active Problem List   Diagnosis Date Noted   Acute bilateral low back pain without sciatica 12/23/2023   Chronic pain of right knee 09/25/2023   Chest pain 09/25/2023   Hyperlipidemia LDL goal <70 09/25/2023   Screening for thyroid  disorder 02/22/2023   Seasonal allergies 08/21/2022   Nausea 08/21/2022   Uncontrolled type 2 diabetes mellitus with hyperglycemia (HCC) 07/24/2022   Obesity (BMI 30-39.9) 07/24/2022   Costochondritis 10/20/2021   Hyperlipidemia associated with type 2 diabetes mellitus (HCC) 10/20/2021   Nonspecific abnormal electrocardiogram (ECG) (EKG) 10/20/2021   Abnormal uterine bleeding (AUB) 07/01/2019   Cervical high risk HPV (human papillomavirus) test positive on 06/26/2019 06/26/2019   Essential hypertension 06/24/2016   Fibroid, uterine 02/27/2013   Hypokalemia 02/27/2013   Malignant hypertension 04/13/2012   Migraine headache 01/16/2011   Anemia 01/16/2011   Abnormal vaginal bleeding 01/16/2011    PCP: ***  REFERRING PROVIDER: ***  REFERRING DIAG: ***  Rationale for Evaluation and Treatment: {HABREHAB:27488}  THERAPY DIAG:  No diagnosis found.  ONSET DATE: ***  SUBJECTIVE:                                                                                                                                                                                           SUBJECTIVE STATEMENT: ***  PERTINENT HISTORY:  ***  PAIN:  Are you having pain? {OPRCPAIN:27236}  PRECAUTIONS: {Therapy precautions:24002}  RED FLAGS: {PT Red  Flags:29287}   WEIGHT BEARING RESTRICTIONS: {Yes ***/No:24003}  FALLS:  Has patient fallen in last 6 months? {fallsyesno:27318}  LIVING ENVIRONMENT: Lives with: {OPRC lives with:25569::"lives with their family"} Lives in: {Lives in:25570} Stairs: {opstairs:27293} Has following equipment at home: {Assistive devices:23999}  OCCUPATION: ***  PLOF: {PLOF:24004}  PATIENT GOALS: ***  NEXT MD VISIT: ***  OBJECTIVE:  Note: Objective measures were completed at Evaluation unless otherwise noted.  DIAGNOSTIC FINDINGS:  ***  PATIENT SURVEYS:  {rehab surveys:24030}  COGNITION: Overall cognitive status: {cognition:24006}     SENSATION: {sensation:27233}  MUSCLE LENGTH: Hamstrings: Right *** deg; Left *** deg Andy Bannister test: Right *** deg; Left *** deg  POSTURE: {posture:25561}  PALPATION: ***  LUMBAR ROM:   AROM eval  Flexion  Extension   Right lateral flexion   Left lateral flexion   Right rotation   Left rotation    (Blank rows = not tested)  LOWER EXTREMITY ROM:     {AROM/PROM:27142}  Right eval Left eval  Hip flexion    Hip extension    Hip abduction    Hip adduction    Hip internal rotation    Hip external rotation    Knee flexion    Knee extension    Ankle dorsiflexion    Ankle plantarflexion    Ankle inversion    Ankle eversion     (Blank rows = not tested)  LOWER EXTREMITY MMT:    MMT Right eval Left eval  Hip flexion    Hip extension    Hip abduction    Hip adduction    Hip internal rotation    Hip external rotation    Knee flexion    Knee extension    Ankle dorsiflexion    Ankle plantarflexion    Ankle inversion    Ankle eversion     (Blank rows = not tested)  LUMBAR SPECIAL TESTS:  {lumbar special test:25242}  FUNCTIONAL TESTS:  {Functional tests:24029}  GAIT: Distance walked: *** Assistive device utilized: {Assistive devices:23999} Level of assistance: {Levels of assistance:24026} Comments: ***  TREATMENT DATE: ***                                                                                                                                  PATIENT EDUCATION:  Education details: *** Person educated: {Person educated:25204} Education method: {Education Method:25205} Education comprehension: {Education Comprehension:25206}  HOME EXERCISE PROGRAM: ***  ASSESSMENT:  CLINICAL IMPRESSION: Patient is a *** y.o. *** who was seen today for physical therapy evaluation and treatment for ***.   OBJECTIVE IMPAIRMENTS: {opptimpairments:25111}.   ACTIVITY LIMITATIONS: {activitylimitations:27494}  PARTICIPATION LIMITATIONS: {participationrestrictions:25113}  PERSONAL FACTORS: {Personal factors:25162} are also affecting patient's functional outcome.   REHAB POTENTIAL: {rehabpotential:25112}  CLINICAL DECISION MAKING: {clinical decision making:25114}  EVALUATION COMPLEXITY: {Evaluation complexity:25115}   GOALS: Goals reviewed with patient? {yes/no:20286}  SHORT TERM GOALS: Target date: ***  *** Baseline: Goal status: INITIAL  2.  *** Baseline:  Goal status: INITIAL  3.  *** Baseline:  Goal status: INITIAL  4.  *** Baseline:  Goal status: INITIAL  5.  *** Baseline:  Goal status: INITIAL  6.  *** Baseline:  Goal status: INITIAL  LONG TERM GOALS: Target date: ***  *** Baseline:  Goal status: INITIAL  2.  *** Baseline:  Goal status: INITIAL  3.  *** Baseline:  Goal status: INITIAL  4.  *** Baseline:  Goal status: INITIAL  5.  *** Baseline:  Goal status: INITIAL  6.  *** Baseline:  Goal status: INITIAL  PLAN:  PT FREQUENCY: {rehab frequency:25116}  PT DURATION: {rehab duration:25117}  PLANNED INTERVENTIONS: {rehab planned interventions:25118::"97110-Therapeutic exercises","97530- Therapeutic [phone]- Neuromuscular re-education","97535- Self FAOZ","30865- Manual therapy"}.  PLAN FOR NEXT  SESSION: ***   Avenir Lozinski, PT 01/20/2024, 7:51 AM

## 2024-01-21 ENCOUNTER — Other Ambulatory Visit: Payer: Self-pay

## 2024-01-23 ENCOUNTER — Ambulatory Visit: Payer: Self-pay | Admitting: Nurse Practitioner

## 2024-01-27 ENCOUNTER — Other Ambulatory Visit: Payer: Self-pay

## 2024-01-27 ENCOUNTER — Other Ambulatory Visit (HOSPITAL_COMMUNITY): Payer: Self-pay

## 2024-01-27 NOTE — Progress Notes (Signed)
 Attempted to contact patient for scheduled appointment for medication management. Left HIPAA compliant message for patient to return my call at their convenience.  Will also reach out via MyChart.   Reviewed chart and submitted PA for GLP-1RA Ozempic to investigate cost/feasibility of escalating pharmacotherapy. Will follow-up.  Key: [id number]  Arthea Larsson, PharmD PGY1 Pharmacy Resident

## 2024-01-28 NOTE — Progress Notes (Addendum)
 PA for OZEMPIC (Key: [id number]) was DENIED on 01/27/24. Insurance prefers Science writer or [employer]oun[employer]. Will resumbit and attempt to re-engage patient as I was  not able to reach her for her appt yesterday.   Submitted PA for TR[employer]ICITY via CoverMyMeds on 01/28/24. Key: [id number]. Trulicity APPROVED 01/29/24.   Arthea Larsson, PharmD PGY1 Pharmacy Resident

## 2024-01-29 ENCOUNTER — Other Ambulatory Visit (HOSPITAL_COMMUNITY): Payer: Self-pay

## 2024-01-29 NOTE — Progress Notes (Signed)
 Attempted to outreach patient today to reschedule pharmacy telephone appointment. LVM with instructions to reschedule.   Arthea Larsson, PharmD PGY1 Pharmacy Resident

## 2024-02-19 ENCOUNTER — Telehealth: Payer: Self-pay | Admitting: *Deleted

## 2024-02-19 NOTE — Progress Notes (Signed)
 Complex Care Management Care Guide Note  02/19/2024 Name: Susan Mathis MRN: [id number] DOB: 02/05/1971  Susan Mathis is a 53 y.o. year old female who is a primary care patient of Paseda, Folashade R, FNP and is actively engaged with the care management team. I reached out to [employer] by phone today to assist with re-scheduling  with the Pharmacist.  Follow up plan: Unsuccessful telephone outreach attempt made. A HIPAA compliant phone message was left for the patient providing contact information and requesting a return call.  Harlene Satterfield  [facility]  Value-Based Care Institute, [facility] Guide  Direct Dial: [phone]  Fax [phone]

## 2024-02-24 ENCOUNTER — Other Ambulatory Visit: Payer: Self-pay

## 2024-02-26 NOTE — Progress Notes (Signed)
 Complex Care Management Care Guide Note  02/26/2024 Name: [name] MRN: [id number] DOB: 1971-08-14  [name] is a 53 y.o. year old female who is a primary care patient of Paseda, Folashade R, FNP and is actively engaged with the care management team. I reached out to [employer] by phone today to assist with re-scheduling  with the Pharmacist.  Follow up plan: Unsuccessful telephone outreach attempt made. A HIPAA compliant phone message was left for the patient providing contact information and requesting a return call.No further outreach attempts will be made at this time. We have been unable to contact the patient to reschedule for complex care management services.   Harlene Satterfield  [facility]  [facility], [facility] Guide  Direct Dial: [phone]  Fax [phone]

## 2024-03-23 ENCOUNTER — Other Ambulatory Visit: Payer: Self-pay

## 2024-04-19 ENCOUNTER — Other Ambulatory Visit: Payer: Self-pay | Admitting: Nurse Practitioner

## 2024-04-19 DIAGNOSIS — M940 Chondrocostal junction syndrome [Tietze]: Secondary | ICD-10-CM

## 2024-04-19 DIAGNOSIS — M545 Low back pain, unspecified: Secondary | ICD-10-CM

## 2024-04-21 ENCOUNTER — Other Ambulatory Visit: Payer: Self-pay

## 2024-04-21 MED ORDER — IBUPROFEN 600 MG PO TABS
600.0000 mg | ORAL_TABLET | Freq: Three times a day (TID) | ORAL | 1 refills | Status: AC | PRN
  Filled 2024-04-21 – 2024-06-01 (×2): qty 30, 10d supply, fill #0
  Filled ????-??-??: fill #0

## 2024-04-21 NOTE — Telephone Encounter (Signed)
 Please advise [facility]

## 2024-04-29 ENCOUNTER — Other Ambulatory Visit: Payer: Self-pay

## 2024-04-30 ENCOUNTER — Other Ambulatory Visit: Payer: Self-pay

## 2024-05-01 ENCOUNTER — Other Ambulatory Visit: Payer: Self-pay

## 2024-06-01 ENCOUNTER — Other Ambulatory Visit (HOSPITAL_COMMUNITY): Payer: Self-pay

## 2024-06-01 ENCOUNTER — Other Ambulatory Visit: Payer: Self-pay

## 2024-06-03 ENCOUNTER — Other Ambulatory Visit: Payer: Self-pay

## 2024-06-03 ENCOUNTER — Encounter: Payer: Self-pay | Admitting: Pharmacist

## 2024-06-05 ENCOUNTER — Other Ambulatory Visit: Payer: Self-pay

## 2024-06-08 ENCOUNTER — Other Ambulatory Visit: Payer: Self-pay

## 2024-06-10 ENCOUNTER — Telehealth: Payer: Self-pay | Admitting: Nurse Practitioner

## 2024-06-10 NOTE — Telephone Encounter (Signed)
 Patient was identified as falling into the [place] Measure - Diabetes.   Patient was: Left voicemail to schedule with primary care provider.

## 2024-06-22 ENCOUNTER — Encounter: Payer: Self-pay | Admitting: Radiology

## 2024-07-15 ENCOUNTER — Encounter (HOSPITAL_COMMUNITY): Payer: Self-pay | Admitting: Emergency Medicine

## 2024-07-15 ENCOUNTER — Emergency Department (HOSPITAL_COMMUNITY)
Admission: EM | Admit: 2024-07-15 | Discharge: 2024-07-15 | Disposition: A | Discharge status: Home / Self Care | Attending: Emergency Medicine | Admitting: Emergency Medicine

## 2024-07-15 ENCOUNTER — Other Ambulatory Visit: Payer: Self-pay

## 2024-07-15 DIAGNOSIS — R238 Other skin changes: Secondary | ICD-10-CM | POA: Insufficient documentation

## 2024-07-15 DIAGNOSIS — Z7982 Long term (current) use of aspirin: Secondary | ICD-10-CM | POA: Insufficient documentation

## 2024-07-15 NOTE — ED Triage Notes (Signed)
 Pt had window cleaner splashed at work today. Pt rinsed eyes out at work. Denies vision trouble at the moment.

## 2024-07-15 NOTE — ED Provider Notes (Signed)
 [place] EMERGENCY DEPARTMENT AT [facility] Provider Note   CSN: [id number] Arrival date & time: 07/15/24  1342     Patient presents with: Eye Injury  HPI Susan Mathis is a 53 y.o. female presenting for irritation in the left periorbital region after window cleaner splashed in her eyes at work today.  Occurred a couple hours ago.  She states that primarily she is here to be checked out.  She denies any pain in her eyes or pain with eye movement.  Denies vision changes.  She states she has some irritation on the skin below her left eye.  She states her eye was irrigated copiously at work.    Eye Injury       Prior to Admission medications   Medication Sig Start Date End Date Taking? Authorizing Provider  amLODipine  (NORVASC ) 10 MG tablet Take 1 tablet (10 mg total) by mouth once daily. 12/23/23   Paseda, Folashade R, FNP  aspirin EC 81 MG tablet Take 81 mg by mouth daily. Swallow whole.    [provider]  Aspirin-Acetaminophen-Caffeine (GOODY HEADACHE PO) Take by mouth. Patient not taking: Reported on 02/12/2023    [provider]  atorvastatin  (LIPITOR) 80 MG tablet Take 1 tablet (80 mg total) by mouth daily. 12/24/23   Paseda, Folashade R, FNP  baclofen  (LIORESAL ) 10 MG tablet Take 1 tablet (10 mg total) by mouth 3 (three) times daily. Patient not taking: Reported on 12/23/2023 12/14/23   Johnie Flaming A, NP  Blood Glucose Monitoring Suppl (TRUE METRIX METER) w/Device KIT 1 kit by Does not apply route in the morning, at noon, in the evening, and at bedtime. 12/22/20   Myrna Camelia HERO, NP  Ferrous Fumarate  (HEMOCYTE - 106 MG FE) 324 (106 Fe) MG TABS tablet Take 1 tablet (106 mg of iron total) by mouth daily. 03/08/20   Myrna Camelia HERO, NP  fluticasone  (FLONASE ) 50 MCG/ACT nasal spray Place 2 sprays into both nostrils daily. 08/21/22   Paseda, Folashade R, FNP  glipiZIDE  (GLUCOTROL ) 5 MG tablet Take 1 tablet (5 mg total) by mouth 2 (two) times daily before  a meal. 12/23/23   Paseda, Folashade R, FNP  glucose blood (TRUE METRIX BLOOD GLUCOSE TEST) test strip Use as instructed 12/22/20   Myrna Camelia HERO, NP  hydrochlorothiazide  (HYDRODIURIL ) 25 MG tablet Take 1 tablet (25 mg total) by mouth once daily. 12/23/23   Paseda, Folashade R, FNP  ibuprofen  (ADVIL ) 600 MG tablet Take 1 tablet (600 mg total) by mouth every 8 (eight) hours as needed. 04/21/24   Paseda, Folashade R, FNP  losartan  (COZAAR ) 25 MG tablet Take 1 tablet (25 mg total) by mouth daily. 12/23/23   Paseda, Folashade R, FNP  metFORMIN  (GLUCOPHAGE ) 1000 MG tablet Take 1 tablet (1,000 mg total) by mouth 2 (two) times daily with a meal. 12/23/23   Paseda, Folashade R, FNP  Multiple Vitamin (MULTIVITAMIN) capsule Take 1 capsule by mouth daily.    [provider]  ondansetron  (ZOFRAN ) 4 MG tablet Take 1 tablet (4 mg total) by mouth every 8 (eight) hours as needed for nausea or vomiting. Patient not taking: Reported on 01/01/2023 08/21/22   Paseda, Folashade R, FNP  TRUEplus Lancets 28G MISC use as directed in the morning, at noon, in the evening, and at bedtime. 12/22/20   Myrna Camelia HERO, NP    Allergies: Patient has no known allergies.    Review of Systems See HPI  Updated Vital Signs BP (!) 138/91 (BP  Location: Left Arm)   Pulse 76   Temp 97.8 F (36.6 C)   Resp 16   Ht 5' 6 (1.676 m)   Wt 92 kg   SpO2 96%   BMI 32.74 kg/m   Physical Exam Constitutional:      Appearance: Normal appearance.  HENT:     Head: Normocephalic.     Nose: Nose normal.  Eyes:     General: Lids are normal. Lids are everted, no foreign bodies appreciated. Vision grossly intact. Gaze aligned appropriately.     Extraocular Movements: Extraocular movements intact.     Conjunctiva/sclera: Conjunctivae normal.     Left eye: Left conjunctiva is not injected. No chemosis, exudate or hemorrhage.    Comments: No erythema, edema in the left periorbital region.  No tenderness to palpation.  Is not hot to touch. No  discharge.  Pulmonary:     Effort: Pulmonary effort is normal.  Neurological:     Mental Status: She is alert.  Psychiatric:        Mood and Affect: Mood normal.     (all labs ordered are listed, but only abnormal results are displayed) Labs Reviewed - No data to display  EKG: None  Radiology: No results found.   Procedures   Medications Ordered in the ED - No data to display                                  Medical Decision Making  53 year old well-appearing female presenting for left periorbital skin irritation after window cleaner splashed in her eye earlier today.  On exam the eye itself and periorbital region appeared normal without evidence of trauma, anaphylaxis or infection.  On my encounter with her as well she denied any vision changes, pain with eye movement.  Given how well she appears without any associated symptoms to suggest glaucoma, traumatic eye injury, burn, or corneal abrasion, felt it was appropriate for her to be discharged and follow-up with her PCP.  Advised Benadryl for skin irritation at home.  Discussed return precautions.  Discharged.     Final diagnoses:  Skin irritation    ED Discharge Orders     None          Lang Norleen POUR, PA-C 07/15/24 1517    Stanek, Lawrence S, MD 07/15/24 1730

## 2024-07-15 NOTE — Discharge Instructions (Addendum)
 Evaluation today was reassuring.  Please follow-up with your PCP.  You can use Benadryl at home if skin irritation continues.  If you have vision changes, have pain with eye movement, swelling or redness around the eye, pustulant discharge from the eye or any other concerning symptom please return to the ED for further evaluation.

## 2024-07-28 ENCOUNTER — Other Ambulatory Visit (HOSPITAL_COMMUNITY): Payer: Self-pay

## 2024-07-29 ENCOUNTER — Other Ambulatory Visit (HOSPITAL_COMMUNITY): Payer: Self-pay

## 2024-07-31 ENCOUNTER — Other Ambulatory Visit (HOSPITAL_COMMUNITY): Payer: Self-pay

## 2024-08-02 ENCOUNTER — Other Ambulatory Visit (HOSPITAL_COMMUNITY): Payer: Self-pay

## 2024-08-10 ENCOUNTER — Other Ambulatory Visit (HOSPITAL_COMMUNITY): Payer: Self-pay

## 2024-08-12 ENCOUNTER — Other Ambulatory Visit (HOSPITAL_COMMUNITY): Payer: Self-pay

## 2024-08-19 ENCOUNTER — Other Ambulatory Visit (HOSPITAL_COMMUNITY): Payer: Self-pay

## 2024-08-21 ENCOUNTER — Other Ambulatory Visit (HOSPITAL_COMMUNITY): Payer: Self-pay

## 2024-08-23 ENCOUNTER — Other Ambulatory Visit (HOSPITAL_COMMUNITY): Payer: Self-pay

## 2024-09-29 ENCOUNTER — Encounter

## 2024-12-15 ENCOUNTER — Encounter
